# Patient Record
Sex: Female | Born: 1970 | Race: White | Hispanic: No | Marital: Single | Smoking: Current every day smoker
Health system: Southern US, Community
[De-identification: ages and names within clinical notes are randomized; demographics above are authoritative.]

## PROBLEM LIST (undated history)

## (undated) DIAGNOSIS — R3915 Urgency of urination: Secondary | ICD-10-CM

## (undated) DIAGNOSIS — Z8619 Personal history of other infectious and parasitic diseases: Secondary | ICD-10-CM

## (undated) DIAGNOSIS — N201 Calculus of ureter: Secondary | ICD-10-CM

## (undated) DIAGNOSIS — R319 Hematuria, unspecified: Secondary | ICD-10-CM

## (undated) DIAGNOSIS — T4145XA Adverse effect of unspecified anesthetic, initial encounter: Secondary | ICD-10-CM

## (undated) DIAGNOSIS — T8859XA Other complications of anesthesia, initial encounter: Secondary | ICD-10-CM

## (undated) DIAGNOSIS — R350 Frequency of micturition: Secondary | ICD-10-CM

## (undated) DIAGNOSIS — Z8742 Personal history of other diseases of the female genital tract: Secondary | ICD-10-CM

## (undated) HISTORY — PX: TUBAL LIGATION: SHX77

---

## 1998-04-05 HISTORY — PX: SHOULDER ARTHROSCOPY WITH ROTATOR CUFF REPAIR: SHX5685

## 2000-07-13 ENCOUNTER — Emergency Department (HOSPITAL_COMMUNITY): Admission: EM | Admit: 2000-07-13 | Discharge: 2000-07-14 | Payer: Self-pay | Admitting: Emergency Medicine

## 2000-09-16 ENCOUNTER — Emergency Department (HOSPITAL_COMMUNITY): Admission: EM | Admit: 2000-09-16 | Discharge: 2000-09-16 | Payer: Self-pay | Admitting: Emergency Medicine

## 2001-07-23 ENCOUNTER — Emergency Department (HOSPITAL_COMMUNITY): Admission: EM | Admit: 2001-07-23 | Discharge: 2001-07-23 | Payer: Self-pay

## 2006-02-16 ENCOUNTER — Emergency Department (HOSPITAL_COMMUNITY): Admission: EM | Admit: 2006-02-16 | Discharge: 2006-02-16 | Payer: Self-pay | Admitting: Emergency Medicine

## 2010-02-21 ENCOUNTER — Emergency Department (HOSPITAL_COMMUNITY): Admission: EM | Admit: 2010-02-21 | Discharge: 2010-02-21 | Payer: Self-pay | Admitting: Emergency Medicine

## 2010-06-16 LAB — BASIC METABOLIC PANEL
CO2: 24 mEq/L (ref 19–32)
Chloride: 107 mEq/L (ref 96–112)
GFR calc non Af Amer: >60 mL/min (ref >60)
Glucose, Bld: 79 mg/dL (ref 70–99)
Potassium: 4 mEq/L (ref 3.5–5.1)
Sodium: 139 mEq/L (ref 135–145)

## 2010-06-16 LAB — GC/CHLAMYDIA PROBE AMP, GENITAL: GC Probe Amp, Genital: NEGATIVE

## 2010-06-16 LAB — URINALYSIS, ROUTINE W REFLEX MICROSCOPIC
Leukocytes, UA: NEGATIVE
Nitrite: NEGATIVE
Specific Gravity, Urine: 1.015 (ref 1.005–1.030)
Urobilinogen, UA: 0.2 mg/dL (ref 0.0–1.0)

## 2010-06-16 LAB — CBC
HCT: 26.3 % — ABNORMAL LOW (ref 36.0–46.0)
Hemoglobin: 8.3 g/dL — ABNORMAL LOW (ref 12.0–15.0)
MCHC: 31.4 g/dL (ref 30.0–36.0)
MCV: 72.4 fL — ABNORMAL LOW (ref 78.0–100.0)

## 2010-06-16 LAB — URINE MICROSCOPIC-ADD ON

## 2010-06-16 LAB — DIFFERENTIAL
Basophils Relative: 1 % (ref 0–1)
Monocytes Absolute: 0.8 10*3/uL (ref 0.1–1.0)
Monocytes Relative: 8 % (ref 3–12)
Neutro Abs: 5.9 10*3/uL (ref 1.7–7.7)

## 2010-06-19 ENCOUNTER — Other Ambulatory Visit (HOSPITAL_COMMUNITY): Payer: Self-pay | Admitting: *Deleted

## 2010-06-19 DIAGNOSIS — N644 Mastodynia: Secondary | ICD-10-CM

## 2010-07-01 ENCOUNTER — Ambulatory Visit (HOSPITAL_COMMUNITY): Payer: PRIVATE HEALTH INSURANCE

## 2010-07-29 ENCOUNTER — Encounter (HOSPITAL_COMMUNITY): Payer: Self-pay

## 2010-08-05 ENCOUNTER — Ambulatory Visit (HOSPITAL_COMMUNITY)
Admission: RE | Admit: 2010-08-05 | Payer: Medicaid Other | Source: Ambulatory Visit | Attending: *Deleted | Admitting: *Deleted

## 2010-08-12 ENCOUNTER — Ambulatory Visit (HOSPITAL_COMMUNITY)
Admission: RE | Admit: 2010-08-12 | Payer: Medicaid Other | Source: Ambulatory Visit | Attending: *Deleted | Admitting: *Deleted

## 2011-09-22 ENCOUNTER — Emergency Department (HOSPITAL_COMMUNITY)
Admission: EM | Admit: 2011-09-22 | Discharge: 2011-09-23 | Disposition: A | Discharge status: Home / Self Care | Payer: Self-pay | Attending: Emergency Medicine | Admitting: Emergency Medicine

## 2011-09-22 ENCOUNTER — Emergency Department (HOSPITAL_COMMUNITY): Payer: Self-pay

## 2011-09-22 ENCOUNTER — Encounter (HOSPITAL_COMMUNITY): Payer: Self-pay | Admitting: *Deleted

## 2011-09-22 DIAGNOSIS — R109 Unspecified abdominal pain: Secondary | ICD-10-CM | POA: Insufficient documentation

## 2011-09-22 DIAGNOSIS — A499 Bacterial infection, unspecified: Secondary | ICD-10-CM | POA: Insufficient documentation

## 2011-09-22 DIAGNOSIS — F17200 Nicotine dependence, unspecified, uncomplicated: Secondary | ICD-10-CM | POA: Insufficient documentation

## 2011-09-22 DIAGNOSIS — B9689 Other specified bacterial agents as the cause of diseases classified elsewhere: Secondary | ICD-10-CM | POA: Insufficient documentation

## 2011-09-22 DIAGNOSIS — N730 Acute parametritis and pelvic cellulitis: Secondary | ICD-10-CM

## 2011-09-22 DIAGNOSIS — N739 Female pelvic inflammatory disease, unspecified: Secondary | ICD-10-CM | POA: Insufficient documentation

## 2011-09-22 DIAGNOSIS — K8020 Calculus of gallbladder without cholecystitis without obstruction: Secondary | ICD-10-CM | POA: Insufficient documentation

## 2011-09-22 DIAGNOSIS — R110 Nausea: Secondary | ICD-10-CM | POA: Insufficient documentation

## 2011-09-22 DIAGNOSIS — N760 Acute vaginitis: Secondary | ICD-10-CM | POA: Insufficient documentation

## 2011-09-22 LAB — PREGNANCY, URINE: Preg Test, Ur: NEGATIVE

## 2011-09-22 LAB — CBC
HCT: 26.9 % — ABNORMAL LOW (ref 36.0–46.0)
Hemoglobin: 8.2 g/dL — ABNORMAL LOW (ref 12.0–15.0)
MCH: 23 pg — ABNORMAL LOW (ref 26.0–34.0)
MCHC: 30.5 g/dL (ref 30.0–36.0)

## 2011-09-22 LAB — DIFFERENTIAL
Basophils Relative: 0 % (ref 0–1)
Eosinophils Absolute: 0.1 10*3/uL (ref 0.0–0.7)
Monocytes Absolute: 1.6 10*3/uL — ABNORMAL HIGH (ref 0.1–1.0)
Monocytes Relative: 11 % (ref 3–12)
Neutrophils Relative %: 75 % (ref 43–77)

## 2011-09-22 LAB — URINALYSIS, ROUTINE W REFLEX MICROSCOPIC
Glucose, UA: NEGATIVE mg/dL
Leukocytes, UA: NEGATIVE
Protein, ur: NEGATIVE mg/dL
pH: 7 (ref 5.0–8.0)

## 2011-09-22 LAB — URINE MICROSCOPIC-ADD ON

## 2011-09-22 LAB — COMPREHENSIVE METABOLIC PANEL
Albumin: 3.9 g/dL (ref 3.5–5.2)
BUN: 11 mg/dL (ref 6–23)
Calcium: 9.5 mg/dL (ref 8.4–10.5)
Creatinine, Ser: 0.6 mg/dL (ref 0.50–1.10)
Total Protein: 7.5 g/dL (ref 6.0–8.3)

## 2011-09-22 MED ORDER — DEXTROSE 5 % IV SOLN
1.0000 g | Freq: Once | INTRAVENOUS | Status: AC
  Administered 2011-09-22: 1 g via INTRAVENOUS
  Filled 2011-09-22: qty 10

## 2011-09-22 MED ORDER — ONDANSETRON HCL 4 MG/2ML IJ SOLN
4.0000 mg | Freq: Once | INTRAMUSCULAR | Status: AC
  Administered 2011-09-22: 4 mg via INTRAVENOUS
  Filled 2011-09-22: qty 2

## 2011-09-22 MED ORDER — SODIUM CHLORIDE 0.9 % IV SOLN
INTRAVENOUS | Status: DC
  Administered 2011-09-22: 22:00:00 via INTRAVENOUS

## 2011-09-22 MED ORDER — MORPHINE SULFATE 4 MG/ML IJ SOLN
4.0000 mg | Freq: Once | INTRAMUSCULAR | Status: AC
  Administered 2011-09-22: 4 mg via INTRAVENOUS
  Filled 2011-09-22: qty 1

## 2011-09-22 NOTE — ED Provider Notes (Signed)
History   This chart was scribed for Ward Givens, MD by Charolett Bumpers . The patient was seen in room APA11/APA11.    CSN: [id number]  Arrival date & time 09/22/11  1910   First MD Initiated Contact with Patient 09/22/11 2127      Chief Complaint  Patient presents with  . Abdominal Pain    (Consider location/radiation/quality/duration/timing/severity/associated sxs/prior treatment) HPI Claire Bradley is a 41 y.o. female who presents to the Emergency Department complaining of constant, moderate lower pelvic pain with associated nausea and  loose stools since yesterday. Patient states that at 6 am today, she woke up to have a BM and had diaphoresis. Patient states that she has had 2 episodes of loose stools, but denies diarrhea. Patient denies any fevers or vomiting. Patient denies any dysuria or increased frequency. Patient also reports a mild headache. Patient states that she smokes a pack/day. Patient denies any alcohol use. Patient states that she works at [employer] in [place]. Patient denies any known sick contacts. Patient denies any prior h/o similar symptoms.  LNMP was 2 weeks ago. Patient states that she is sexually active with same partner for the past two years and denies any vaginal discharge.She denies dysuria or frequency.  Patient states that the symptoms are aggravated with standing, walking, coughing and movement. Patient states that the symptoms are alleviated with "curling up in a ball".   PCP: [facility].    History reviewed. No pertinent past medical history.  Past Surgical History  Procedure Date  . Cesarean section   . Shoulder surgery   . Tubal ligation     History reviewed. No pertinent family history.  History  Substance Use Topics  . Smoking status: Current Everyday Smoker  . Smokeless tobacco: Not on file  . Alcohol Use: No  employed  OB History    Grav Para Term Preterm Abortions TAB SAB Ect Mult Living                  Review of Systems  Constitutional: Negative for fever.  Gastrointestinal: Positive for nausea and abdominal pain. Negative for vomiting and diarrhea.  Genitourinary: Negative for dysuria, frequency and vaginal discharge.  All other systems reviewed and are negative.    Allergies  Penicillins  Home Medications  No current outpatient prescriptions on file.  BP 121/52  Pulse 97  Temp 98.2 F (36.8 C) (Oral)  Resp 20  Ht 5\' 9"  (1.753 m)  Wt 200 lb (90.719 kg)  BMI 29.53 kg/m2  SpO2 100%  LMP 08/30/2011  Vital signs normal    Physical Exam  Nursing note and vitals reviewed. Constitutional: She is oriented to person, place, and time. She appears well-developed and well-nourished. No distress.       obese  HENT:  Head: Normocephalic and atraumatic.  Mouth/Throat: No oropharyngeal exudate.       Dry tongue.   Eyes: Conjunctivae and EOM are normal. Pupils are equal, round, and reactive to light.  Neck: Normal range of motion. Neck supple. No tracheal deviation present.  Cardiovascular: Normal rate, regular rhythm, normal heart sounds and intact distal pulses.  Exam reveals no gallop and no friction rub.   No murmur heard. Pulmonary/Chest: Effort normal. No respiratory distress.  Abdominal: Soft. Bowel sounds are normal. She exhibits no distension. There is tenderness. There is no rebound and no guarding.       Diffusely suprapubic, LLQ, RLQ tenderness. Without localization.    Genitourinary:  Normal external genital. Small amount of white discharge. Tender uterus and ovaries bilaterally without masses.    Musculoskeletal: Normal range of motion. She exhibits no edema and no tenderness.  Neurological: She is alert and oriented to person, place, and time. No cranial nerve deficit or sensory deficit. Coordination normal.  Skin: Skin is warm and dry. No pallor.  Psychiatric: She has a normal mood and affect. Her behavior is normal.    ED Course  Procedures  (including critical care time)   Medications  0.9 %  sodium chloride infusion (  Intravenous New Bag/Given 09/22/11 2215)  ketorolac (TORADOL) 30 MG/ML injection 30 mg (not administered)  ondansetron (ZOFRAN) injection 4 mg (4 mg Intravenous Given 09/22/11 2215)  morphine 4 MG/ML injection 4 mg (4 mg Intravenous Given 09/22/11 2318)  cefTRIAXone (ROCEPHIN) 1 g in dextrose 5 % 50 mL IVPB (1 g Intravenous Given 09/22/11 2318)  iohexol (OMNIPAQUE) 300 MG/ML solution 100 mL (100 mL Intravenous Contrast Given 09/23/11 0029)     DIAGNOSTIC STUDIES: Oxygen Saturation is 100% on room air, normal by my interpretation.    COORDINATION OF CARE:  2147: Discussed planned course of treatment with the patient, who is agreeable at this time.  2200: Medication Orders: Ondansetron (Zofran) injection 4 mg-once; 0.9% sodium chloride infusion-continuous.  2311: Preformed pelvic exam.    Results for orders placed during the hospital encounter of 09/22/11  CBC      Component Value Range   WBC 15.3 (*) 4.0 - 10.5 K/uL   RBC 3.56 (*) 3.87 - 5.11 MIL/uL   Hemoglobin 8.2 (*) 12.0 - 15.0 g/dL   HCT 16.1 (*) 09.6 - 04.5 %   MCV 75.6 (*) 78.0 - 100.0 fL   MCH 23.0 (*) 26.0 - 34.0 pg   MCHC 30.5  30.0 - 36.0 g/dL   RDW 40.9 (*) 81.1 - 91.4 %   Platelets 456 (*) 150 - 400 K/uL  DIFFERENTIAL      Component Value Range   Neutrophils Relative 75  43 - 77 %   Neutro Abs 11.5 (*) 1.7 - 7.7 K/uL   Lymphocytes Relative 13  12 - 46 %   Lymphs Abs 2.0  0.7 - 4.0 K/uL   Monocytes Relative 11  3 - 12 %   Monocytes Absolute 1.6 (*) 0.1 - 1.0 K/uL   Eosinophils Relative 1  0 - 5 %   Eosinophils Absolute 0.1  0.0 - 0.7 K/uL   Basophils Relative 0  0 - 1 %   Basophils Absolute 0.0  0.0 - 0.1 K/uL  COMPREHENSIVE METABOLIC PANEL      Component Value Range   Sodium 135  135 - 145 mEq/L   Potassium 4.0  3.5 - 5.1 mEq/L   Chloride 101  96 - 112 mEq/L   CO2 24  19 - 32 mEq/L   Glucose, Bld 93  70 - 99 mg/dL   BUN 11  6  - 23 mg/dL   Creatinine, Ser 7.82  0.50 - 1.10 mg/dL   Calcium 9.5  8.4 - 95.6 mg/dL   Total Protein 7.5  6.0 - 8.3 g/dL   Albumin 3.9  3.5 - 5.2 g/dL   AST 15  0 - 37 U/L   ALT 8  0 - 35 U/L   Alkaline Phosphatase 61  39 - 117 U/L   Total Bilirubin 0.1 (*) 0.3 - 1.2 mg/dL   GFR calc non Af Amer >90  >90 mL/min   GFR calc  Af Amer >90  >90 mL/min  URINALYSIS, ROUTINE W REFLEX MICROSCOPIC      Component Value Range   Color, Urine YELLOW  YELLOW   APPearance CLEAR  CLEAR   Specific Gravity, Urine 1.010  1.005 - 1.030   pH 7.0  5.0 - 8.0   Glucose, UA NEGATIVE  NEGATIVE mg/dL   Hgb urine dipstick TRACE (*) NEGATIVE   Bilirubin Urine NEGATIVE  NEGATIVE   Ketones, ur NEGATIVE  NEGATIVE mg/dL   Protein, ur NEGATIVE  NEGATIVE mg/dL   Urobilinogen, UA 0.2  0.0 - 1.0 mg/dL   Nitrite NEGATIVE  NEGATIVE   Leukocytes, UA NEGATIVE  NEGATIVE  PREGNANCY, URINE      Component Value Range   Preg Test, Ur NEGATIVE  NEGATIVE  WET PREP, GENITAL      Component Value Range   Yeast Wet Prep HPF POC NONE SEEN  NONE SEEN   Trich, Wet Prep NONE SEEN  NONE SEEN   Clue Cells Wet Prep HPF POC MANY (*) NONE SEEN   WBC, Wet Prep HPF POC FEW (*) NONE SEEN  URINE MICROSCOPIC-ADD ON      Component Value Range   Squamous Epithelial / LPF FEW (*) RARE   WBC, UA 0-2  <3 WBC/hpf   RBC / HPF 0-2  <3 RBC/hpf   Bacteria, UA RARE  RARE   Laboratory interpretation all normal except anemia .  Ct Abdomen Pelvis W Contrast  09/23/2011  *RADIOLOGY REPORT*  Clinical Data: Lower abdominal pain.  Nausea.  Clinical suspicion for appendicitis.  CT ABDOMEN AND PELVIS WITH CONTRAST  Technique:  Multidetector CT imaging of the abdomen and pelvis was performed following the standard protocol during bolus administration of intravenous contrast.  Contrast: OMNIPAQUE IOHEXOL 300 MG/ML  SOLN  Comparison: 02/21/2010  Findings: Mild hepatic steatosis is noted.  No liver masses are identified.  Cholesterol gallstone is seen,  however there is no evidence of acute cholecystitis or biliary ductal dilatation.  The pancreas, spleen, adrenal glands, and kidneys are normal in appearance.  Uterus and adnexa are unremarkable in appearance. Surgical clips are seen from bilateral tubal ligation.  No soft tissue masses or lymphadenopathy identified within the abdomen or pelvis.  Tiny amount of free fluid in the pelvic cul-de-sac is likely physiologic.  Normal appendix is visualized.  No evidence of inflammatory process or abscess within the abdomen or pelvis.  No evidence of bowel wall thickening or dilatation.  IMPRESSION:  1.  No evidence of appendicitis. 2.  Tiny amount of free fluid and pelvic cul-de-sac, most likely physiologic in a reproductive age female. 3.  Cholelithiasis.  No evidence of cholecystitis. 4.  Mild hepatic steatosis.  Original Report Authenticated By: Danae Orleans, M.D.     1. PID (acute pelvic inflammatory disease)   2. BV (bacterial vaginosis)    New Prescriptions   AZITHROMYCIN (ZITHROMAX) 250 MG TABLET    Take all 4 pills at one sitting   METRONIDAZOLE (FLAGYL) 500 MG TABLET    Take 1 tablet (500 mg total) by mouth 3 (three) times daily.   PROMETHAZINE (PHENERGAN) 25 MG SUPPOSITORY    Place 1 suppository (25 mg total) rectally every 6 (six) hours as needed for nausea.   TRAMADOL (ULTRAM) 50 MG TABLET    Take 2 tablets (100 mg total) by mouth every 6 (six) hours as needed for pain.    Plan discharge  Devoria Albe, MD, FACEP    MDM   I personally performed  the services described in this documentation, which was scribed in my presence. The recorded information has been reviewed and considered. Devoria Albe, MD, Armando Gang       Ward Givens, MD 09/23/11 [phone]

## 2011-09-22 NOTE — ED Notes (Signed)
Abd pain since yesterday, Nausea, no vomiting , 3 episodes of loose stools.  No fever. No uti sx

## 2011-09-23 LAB — RPR: RPR Ser Ql: NONREACTIVE

## 2011-09-23 LAB — WET PREP, GENITAL

## 2011-09-23 MED ORDER — METRONIDAZOLE 500 MG PO TABS: 500.0000 mg | ORAL_TABLET | Freq: Three times a day (TID) | ORAL | Status: AC

## 2011-09-23 MED ORDER — KETOROLAC TROMETHAMINE 30 MG/ML IJ SOLN
30.0000 mg | Freq: Once | INTRAMUSCULAR | Status: AC
  Administered 2011-09-23: 30 mg via INTRAVENOUS
  Filled 2011-09-23: qty 1

## 2011-09-23 MED ORDER — TRAMADOL HCL 50 MG PO TABS: 100.0000 mg | ORAL_TABLET | Freq: Four times a day (QID) | ORAL | Status: AC | PRN

## 2011-09-23 MED ORDER — TRAMADOL HCL 50 MG PO TABS
100.0000 mg | ORAL_TABLET | Freq: Once | ORAL | Status: AC
  Administered 2011-09-23: 100 mg via ORAL
  Filled 2011-09-23: qty 2

## 2011-09-23 MED ORDER — IOHEXOL 300 MG/ML  SOLN
100.0000 mL | Freq: Once | INTRAMUSCULAR | Status: AC | PRN
  Administered 2011-09-23: 100 mL via INTRAVENOUS

## 2011-09-23 MED ORDER — AZITHROMYCIN 250 MG PO TABS: ORAL_TABLET | ORAL | Status: DC

## 2011-09-23 MED ORDER — PROMETHAZINE HCL 25 MG RE SUPP: 25.0000 mg | Freq: Four times a day (QID) | RECTAL | Status: DC | PRN

## 2011-09-23 NOTE — Discharge Instructions (Signed)
Take the antibiotics until gone. Take the tramadol with ibuprofen 600 mg 4 times a day for pain. Have your sexual partner get checked also. Recheck if you get a high fever, or have uncontrollable vomiting.   Bacterial Vaginosis Bacterial vaginosis (BV) is a vaginal infection where the normal balance of bacteria in the vagina is disrupted. The normal balance is then replaced by an overgrowth of certain bacteria. There are several different kinds of bacteria that can cause BV. BV is the most common vaginal infection in women of childbearing age. CAUSES   The cause of BV is not fully understood. BV develops when there is an increase or imbalance of harmful bacteria.   Some activities or behaviors can upset the normal balance of bacteria in the vagina and put women at increased risk including:   Having a new sex partner or multiple sex partners.   Douching.   Using an intrauterine device (IUD) for contraception.   It is not clear what role sexual activity plays in the development of BV. However, women that have never had sexual intercourse are rarely infected with BV.  Women do not get BV from toilet seats, bedding, swimming pools or from touching objects around them.  SYMPTOMS   Grey vaginal discharge.   A fish-like odor with discharge, especially after sexual intercourse.   Itching or burning of the vagina and vulva.   Burning or pain with urination.   Some women have no signs or symptoms at all.  DIAGNOSIS  Your caregiver must examine the vagina for signs of BV. Your caregiver will perform lab tests and look at the sample of vaginal fluid through a microscope. They will look for bacteria and abnormal cells (clue cells), a pH test higher than 4.5, and a positive amine test all associated with BV.  RISKS AND COMPLICATIONS   Pelvic inflammatory disease (PID).   Infections following gynecology surgery.   Developing HIV.   Developing herpes virus.  TREATMENT  Sometimes BV will  clear up without treatment. However, all women with symptoms of BV should be treated to avoid complications, especially if gynecology surgery is planned. Female partners generally do not need to be treated. However, BV may spread between female sex partners so treatment is helpful in preventing a recurrence of BV.   BV may be treated with antibiotics. The antibiotics come in either pill or vaginal cream forms. Either can be used with nonpregnant or pregnant women, but the recommended dosages differ. These antibiotics are not harmful to the baby.   BV can recur after treatment. If this happens, a second round of antibiotics will often be prescribed.   Treatment is important for pregnant women. If not treated, BV can cause a premature delivery, especially for a pregnant woman who had a premature birth in the past. All pregnant women who have symptoms of BV should be checked and treated.   For chronic reoccurrence of BV, treatment with a type of prescribed gel vaginally twice a week is helpful.  HOME CARE INSTRUCTIONS   Finish all medication as directed by your caregiver.   Do not have sex until treatment is completed.   Tell your sexual partner that you have a vaginal infection. They should see their caregiver and be treated if they have problems, such as a mild rash or itching.   Practice safe sex. Use condoms. Only have 1 sex partner.  PREVENTION  Basic prevention steps can help reduce the risk of upsetting the natural balance of bacteria in  the vagina and developing BV:  Do not have sexual intercourse (be abstinent).   Do not douche.   Use all of the medicine prescribed for treatment of BV, even if the signs and symptoms go away.   Tell your sex partner if you have BV. That way, they can be treated, if needed, to prevent reoccurrence.  SEEK MEDICAL CARE IF:   Your symptoms are not improving after 3 days of treatment.   You have increased discharge, pain, or fever.  MAKE SURE YOU:    Understand these instructions.   Will watch your condition.   Will get help right away if you are not doing well or get worse.  FOR MORE INFORMATION  Division of STD Prevention (DSTDP), Centers for Disease Control and Prevention: [employer] American Social Health Association (ASHA): www.ashastd.org  Document Released: 03/22/2005 Document Revised: 03/11/2011 Document Reviewed: 09/12/2008 [facility] Patient Information 2012 [place], [place].Pelvic Inflammatory Disease Pelvic Inflammatory Disease (PID) is an infection in some or all of your female organs. This includes the womb (uterus), ovaries, fallopian tubes and tissues in the pelvis. PID is a common cause of sudden onset (acute) lower abdominal (pelvic) pain. PID can be treated, but it is a serious infection. It may take weeks before you are completely well. In some cases, hospitalization is needed for surgery or to administer medications to kill germs (antibiotics) through your veins (intravenously). CAUSES   It may be caused by germs that are spread during sexual contact.   PID can also occur following:   The birth of a baby.   A miscarriage.   An abortion.   Major surgery of the pelvis.   Use of an IUD.   Sexual assault.  SYMPTOMS   Abdominal or pelvic pain.   Fever.   Chills.   Abnormal vaginal discharge.  DIAGNOSIS  Your caregiver will choose some of these methods to make a diagnosis:  A physical exam and history.   Blood tests.   Cultures of the vagina and cervix.   X-rays or ultrasound.   A procedure to look inside the pelvis (laparoscopy).  TREATMENT   Use of antibiotics by mouth or intravenously.   Treatment of sexual partners when the infection is an sexually transmitted disease (STD).   Hospitalization and surgery may be needed.  RISKS AND COMPLICATIONS   PID can cause women to become unable to have children (sterile) if left untreated or if partially treated. That is why it is important to  finish all medications given to you.   Sterility or future tubal (ectopic) pregnancies can occur in fully treated individuals. This is why it is so important to follow your prescribed treatment.   It can cause longstanding (chronic) pelvic pain after frequent infections.   Painful intercourse.   Pelvic abscesses.   In rare cases, surgery or a hysterectomy may be needed.   If this is a sexually transmitted infection (STI), you are also at risk for any other STD including AIDSor human papillomavirus (HPV).  HOME CARE INSTRUCTIONS   Finish all medication as prescribed. Incomplete treatment will put you at risk for sterility and tubal pregnancy.   Only take over-the-counter or prescription medicines for pain, discomfort, or fever as directed by your caregiver.   Do not have sex until treatment is completed or as directed by your caregiver. If PID is confirmed, your recent sexual contacts will need treatment.   Keep your follow-up appointments.  SEEK MEDICAL CARE IF:   You have increased or abnormal vaginal  discharge.   You need prescription medication for your pain.   Your partner has an STD.   You are vomiting.   You cannot take your medications.  SEEK IMMEDIATE MEDICAL CARE IF:   You have a fever.   You develop increased abdominal or pelvic pain.   You develop chills.   You have pain when you urinate.   You are not better after 72 hours following treatment.  Document Released: 03/22/2005 Document Revised: 03/11/2011 Document Reviewed: 12/03/2006 [facility] Patient Information 2012 [place], [place].

## 2011-09-23 NOTE — ED Notes (Signed)
Pt stable and ambulatory at discharge; Pt instructed to have sexual partner checked also; Pt also instructed to finish all antibotics

## 2011-09-23 NOTE — ED Notes (Signed)
Patient transported to CT 

## 2011-09-24 LAB — GC/CHLAMYDIA PROBE AMP, GENITAL: Chlamydia, DNA Probe: NEGATIVE

## 2012-06-08 ENCOUNTER — Emergency Department (HOSPITAL_COMMUNITY)
Admission: EM | Admit: 2012-06-08 | Discharge: 2012-06-08 | Disposition: A | Discharge status: Home / Self Care | Payer: Self-pay | Attending: Emergency Medicine | Admitting: Emergency Medicine

## 2012-06-08 ENCOUNTER — Encounter (HOSPITAL_COMMUNITY): Payer: Self-pay | Admitting: *Deleted

## 2012-06-08 DIAGNOSIS — R110 Nausea: Secondary | ICD-10-CM | POA: Insufficient documentation

## 2012-06-08 DIAGNOSIS — M549 Dorsalgia, unspecified: Secondary | ICD-10-CM | POA: Insufficient documentation

## 2012-06-08 DIAGNOSIS — L02213 Cutaneous abscess of chest wall: Secondary | ICD-10-CM

## 2012-06-08 DIAGNOSIS — F17200 Nicotine dependence, unspecified, uncomplicated: Secondary | ICD-10-CM | POA: Insufficient documentation

## 2012-06-08 DIAGNOSIS — L02219 Cutaneous abscess of trunk, unspecified: Secondary | ICD-10-CM | POA: Insufficient documentation

## 2012-06-08 DIAGNOSIS — R059 Cough, unspecified: Secondary | ICD-10-CM | POA: Insufficient documentation

## 2012-06-08 MED ORDER — SULFAMETHOXAZOLE-TRIMETHOPRIM 800-160 MG PO TABS: 1.0000 | ORAL_TABLET | Freq: Two times a day (BID) | ORAL | Status: DC

## 2012-06-08 MED ORDER — TRAMADOL HCL 50 MG PO TABS: 50.0000 mg | ORAL_TABLET | Freq: Four times a day (QID) | ORAL | Status: DC | PRN

## 2012-06-08 MED ORDER — LIDOCAINE HCL (PF) 1 % IJ SOLN
INTRAMUSCULAR | Status: AC
  Administered 2012-06-08: 5 mL
  Filled 2012-06-08: qty 5

## 2012-06-08 MED ORDER — OXYCODONE-ACETAMINOPHEN 5-325 MG PO TABS
ORAL_TABLET | ORAL | Status: AC
  Administered 2012-06-08: 1
  Filled 2012-06-08: qty 1

## 2012-06-08 NOTE — ED Notes (Signed)
Dressing applied to site of I and D.  No active bleeding.

## 2012-06-08 NOTE — ED Notes (Signed)
Red swollen area between  Breasts, No drainage, Headache

## 2012-06-08 NOTE — ED Provider Notes (Signed)
History     CSN: [id number]  Arrival date & time 06/08/12  1251   First MD Initiated Contact with Patient 06/08/12 1310      Chief Complaint  Patient presents with  . Abscess    (Consider location/radiation/quality/duration/timing/severity/associated sxs/prior treatment) HPI Claire Bradley is a 42 y.o. female who presents to the ED with a skin problem. The problem started about a week ago. The area is located on the chest wall between the breast. The area started as a little pimple that she tried to mash and now the area has gotten red, tender and swollen. The history was provided by the patient.  History reviewed. No pertinent past medical history.  Past Surgical History  Procedure Laterality Date  . Cesarean section    . Shoulder surgery    . Tubal ligation      History reviewed. No pertinent family history.  History  Substance Use Topics  . Smoking status: Current Every Day Smoker  . Smokeless tobacco: Not on file  . Alcohol Use: No    OB History   Grav Para Term Preterm Abortions TAB SAB Ect Mult Living                  Review of Systems  Constitutional: Negative for fever, chills and activity change.  HENT: Negative for congestion and neck pain.   Eyes: Negative for pain.  Respiratory: Positive for cough. Negative for chest tightness.   Cardiovascular: Negative for chest pain.  Gastrointestinal: Positive for nausea. Negative for abdominal pain.  Genitourinary: Negative for frequency and pelvic pain.  Musculoskeletal: Positive for back pain.  Allergic/Immunologic: Negative for immunocompromised state.  Neurological: Negative for light-headedness and headaches.  Psychiatric/Behavioral: Negative for confusion.    Allergies  Penicillins  Home Medications   Current Outpatient Rx  Name  Route  Sig  Dispense  Refill  . azithromycin (ZITHROMAX) 250 MG tablet      Take all 4 pills at one sitting   4 tablet   0   . EXPIRED: promethazine (PHENERGAN) 25  MG suppository   Rectal   Place 1 suppository (25 mg total) rectally every 6 (six) hours as needed for nausea.   10 each   0     BP 113/59  Pulse 71  Temp(Src) 97.7 F (36.5 C) (Oral)  Resp 18  Ht 5\' 9"  (1.753 m)  Wt 180 lb (81.647 kg)  BMI 26.57 kg/m2  SpO2 100%  LMP 05/30/2012  Physical Exam  Nursing note and vitals reviewed. Constitutional: She is oriented to person, place, and time. She appears well-developed and well-nourished.  HENT:  Head: Normocephalic and atraumatic.  Eyes: EOM are normal. Pupils are equal, round, and reactive to light.  Neck: Normal range of motion. Neck supple.  Cardiovascular: Normal rate.   Pulmonary/Chest: Effort normal.  Abdominal: Soft. There is no tenderness.  Musculoskeletal: Normal range of motion. She exhibits no edema.  Neurological: She is alert and oriented to person, place, and time. No cranial nerve deficit.  Skin:  Raised, red, fluctuant  area, tender with palpation mid chest wall.  Psychiatric: She has a normal mood and affect. Her behavior is normal. Judgment and thought content normal.    ED Course  Procedures  INCISION AND DRAINAGE Performed by: NEESE,HOPE Consent: Verbal consent obtained. Risks and benefits: risks, benefits and alternatives were discussed  Percocet PO prior to procedure.  Type: abscess  Body area: chest wall  Anesthesia: local infiltration  Incision was made with  a scalpel.  Local anesthetic: lidocaine 1%  Without  epinephrine  Anesthetic total: 1.5 ml  Complexity: complex Blunt dissection to break up loculations  Drainage: purulent  Drainage amount: moderate  Packing material: 1/4 in iodoform gauze  Patient tolerance: Patient tolerated the procedure well with no immediate complications.   Assessment: 42 y.o. female with abscess chest wall with cellusitis  Plan:  Septra DS   Tramadol   Packing removal in 2 days Discussed with the patient and all questioned fully answered. She  will return here or with her PCP for packing removal in 2 days and recheck.   Medication List    TAKE these medications       sulfamethoxazole-trimethoprim 800-160 MG per tablet  Commonly known as:  SEPTRA DS  Take 1 tablet by mouth every 12 (twelve) hours.     traMADol 50 MG tablet  Commonly known as:  ULTRAM  Take 1 tablet (50 mg total) by mouth every 6 (six) hours as needed for pain.      ASK your doctor about these medications       ibuprofen 200 MG tablet  Commonly known as:  ADVIL,MOTRIN  Take 600 mg by mouth every 6 (six) hours as needed for pain.                 Janne Napoleon, [place] 06/08/12 [phone]

## 2012-06-08 NOTE — ED Notes (Signed)
Alert, NAD, Red , swollen area between breasts , present for 6 mos, but in last week has become red , swollen and painful,  No d/c Pt says she has a headache also.

## 2012-06-09 NOTE — ED Provider Notes (Signed)
Medical screening examination/treatment/procedure(s) were performed by non-physician practitioner and as supervising physician I was immediately available for consultation/collaboration.  Donnetta Hutching, MD 06/09/12 1029

## 2012-07-28 ENCOUNTER — Emergency Department (HOSPITAL_COMMUNITY)
Admission: EM | Admit: 2012-07-28 | Discharge: 2012-07-28 | Disposition: A | Discharge status: Home / Self Care | Payer: Self-pay | Attending: Emergency Medicine | Admitting: Emergency Medicine

## 2012-07-28 ENCOUNTER — Emergency Department (HOSPITAL_COMMUNITY): Payer: Self-pay

## 2012-07-28 ENCOUNTER — Encounter (HOSPITAL_COMMUNITY): Payer: Self-pay | Admitting: *Deleted

## 2012-07-28 DIAGNOSIS — F17200 Nicotine dependence, unspecified, uncomplicated: Secondary | ICD-10-CM | POA: Insufficient documentation

## 2012-07-28 DIAGNOSIS — Z3202 Encounter for pregnancy test, result negative: Secondary | ICD-10-CM | POA: Insufficient documentation

## 2012-07-28 DIAGNOSIS — R1084 Generalized abdominal pain: Secondary | ICD-10-CM | POA: Insufficient documentation

## 2012-07-28 DIAGNOSIS — R109 Unspecified abdominal pain: Secondary | ICD-10-CM

## 2012-07-28 DIAGNOSIS — Z880 Allergy status to penicillin: Secondary | ICD-10-CM | POA: Insufficient documentation

## 2012-07-28 DIAGNOSIS — R112 Nausea with vomiting, unspecified: Secondary | ICD-10-CM | POA: Insufficient documentation

## 2012-07-28 DIAGNOSIS — R197 Diarrhea, unspecified: Secondary | ICD-10-CM | POA: Insufficient documentation

## 2012-07-28 LAB — CBC WITH DIFFERENTIAL/PLATELET
Basophils Absolute: 0 10*3/uL (ref 0.0–0.1)
HCT: 27.1 % — ABNORMAL LOW (ref 36.0–46.0)
Lymphocytes Relative: 23 % (ref 12–46)
Monocytes Absolute: 1 10*3/uL (ref 0.1–1.0)
Neutro Abs: 7.9 10*3/uL — ABNORMAL HIGH (ref 1.7–7.7)
Platelets: 349 10*3/uL (ref 150–400)
RDW: 18.1 % — ABNORMAL HIGH (ref 11.5–15.5)
WBC: 11.8 10*3/uL — ABNORMAL HIGH (ref 4.0–10.5)

## 2012-07-28 LAB — URINALYSIS, ROUTINE W REFLEX MICROSCOPIC
Specific Gravity, Urine: 1.025 (ref 1.005–1.030)
Urobilinogen, UA: 0.2 mg/dL (ref 0.0–1.0)

## 2012-07-28 LAB — COMPREHENSIVE METABOLIC PANEL
ALT: 8 U/L (ref 0–35)
AST: 10 U/L (ref 0–37)
CO2: 27 mEq/L (ref 19–32)
Chloride: 104 mEq/L (ref 96–112)
GFR calc non Af Amer: >90 mL/min (ref >90)
Potassium: 4.1 mEq/L (ref 3.5–5.1)
Sodium: 136 mEq/L (ref 135–145)
Total Bilirubin: 0.1 mg/dL — ABNORMAL LOW (ref 0.3–1.2)

## 2012-07-28 LAB — PREGNANCY, URINE: Preg Test, Ur: NEGATIVE

## 2012-07-28 MED ORDER — ONDANSETRON HCL 4 MG/2ML IJ SOLN
4.0000 mg | Freq: Once | INTRAMUSCULAR | Status: AC
  Administered 2012-07-28: 4 mg via INTRAVENOUS
  Filled 2012-07-28: qty 2

## 2012-07-28 MED ORDER — HYDROMORPHONE HCL PF 1 MG/ML IJ SOLN
0.5000 mg | Freq: Once | INTRAMUSCULAR | Status: AC
  Administered 2012-07-28: 0.5 mg via INTRAVENOUS
  Filled 2012-07-28: qty 1

## 2012-07-28 MED ORDER — IOHEXOL 300 MG/ML  SOLN
50.0000 mL | Freq: Once | INTRAMUSCULAR | Status: AC | PRN
  Administered 2012-07-28: 50 mL via ORAL

## 2012-07-28 MED ORDER — SODIUM CHLORIDE 0.9 % IV SOLN
1000.0000 mL | Freq: Once | INTRAVENOUS | Status: AC
  Administered 2012-07-28: 1000 mL via INTRAVENOUS

## 2012-07-28 MED ORDER — HYDROCODONE-ACETAMINOPHEN 5-325 MG PO TABS: 1.0000 | ORAL_TABLET | Freq: Four times a day (QID) | ORAL | Status: DC | PRN

## 2012-07-28 MED ORDER — METOCLOPRAMIDE HCL 10 MG PO TABS: 10.0000 mg | ORAL_TABLET | Freq: Four times a day (QID) | ORAL | Status: DC | PRN

## 2012-07-28 MED ORDER — IOHEXOL 300 MG/ML  SOLN
100.0000 mL | Freq: Once | INTRAMUSCULAR | Status: AC | PRN
  Administered 2012-07-28: 100 mL via INTRAVENOUS

## 2012-07-28 NOTE — ED Notes (Signed)
Pt ambulated to restroom at this time without any problems. Claire Bradley

## 2012-07-28 NOTE — ED Notes (Signed)
Patient with no complaints at this time. Respirations even and unlabored. Skin warm/dry. Discharge instructions reviewed with patient at this time. Patient given opportunity to voice concerns/ask questions. IV removed per policy and band-aid applied to site. Patient discharged at this time and left Emergency Department with steady gait.  

## 2012-07-28 NOTE — ED Notes (Signed)
Pt states abdominal pain and vomiting began today. Pain right abdomen.

## 2012-07-28 NOTE — ED Provider Notes (Signed)
History     CSN: [id number]  Arrival date & time 07/28/12  1515   First MD Initiated Contact with Patient 07/28/12 1547      Chief Complaint  Patient presents with  . Abdominal Pain  . Emesis    (Consider location/radiation/quality/duration/timing/severity/associated sxs/prior treatment) HPI Patient presents with abdominal pain, nausea, vomiting, diarrhea. Symptoms began approximately one week ago, initially with nausea, diarrhea, generalized discomfort.  Over the interval she continues to have nausea, diarrhea, though today she also developed right mid abdominal pain with nausea, vomiting. Since onset of symptoms one week ago, there've been no clear alleviating or exacerbating factors. She denies objective fever. She denies history of abdominal surgery beyond cesarean section.  History reviewed. No pertinent past medical history.  Past Surgical History  Procedure Laterality Date  . Cesarean section    . Shoulder surgery    . Tubal ligation      No family history on file.  History  Substance Use Topics  . Smoking status: Current Every Day Smoker  . Smokeless tobacco: Not on file  . Alcohol Use: No    OB History   Grav Para Term Preterm Abortions TAB SAB Ect Mult Living                  Review of Systems  Constitutional:       Per HPI, otherwise negative  HENT:       Per HPI, otherwise negative  Respiratory:       Per HPI, otherwise negative  Cardiovascular:       Per HPI, otherwise negative  Gastrointestinal: Positive for nausea, vomiting, abdominal pain and diarrhea. Negative for blood in stool.  Endocrine:       Negative aside from HPI  Genitourinary:       Neg aside from HPI   Musculoskeletal:       Per HPI, otherwise negative  Skin: Negative.   Neurological: Negative for syncope.    Allergies  Penicillins  Home Medications   Current Outpatient Rx  Name  Route  Sig  Dispense  Refill  . ibuprofen (ADVIL,MOTRIN) 200 MG tablet   Oral   Take  600 mg by mouth daily as needed for pain.            BP 132/74  Pulse 92  Temp(Src) 98.1 F (36.7 C) (Oral)  Resp 20  Ht 5\' 9"  (1.753 m)  Wt 180 lb (81.647 kg)  BMI 26.57 kg/m2  SpO2 98%  LMP 07/28/2012  Physical Exam  Nursing note and vitals reviewed. Constitutional: She is oriented to person, place, and time. She appears well-developed and well-nourished. No distress.  HENT:  Head: Normocephalic and atraumatic.  Eyes: Conjunctivae and EOM are normal.  Cardiovascular: Normal rate and regular rhythm.   Pulmonary/Chest: Effort normal and breath sounds normal. No stridor. No respiratory distress.  Abdominal: Soft. She exhibits no distension. There is no hepatosplenomegaly. There is tenderness. There is guarding. There is no rigidity, no rebound and no CVA tenderness.  Musculoskeletal: She exhibits no edema.  Neurological: She is alert and oriented to person, place, and time. No cranial nerve deficit.  Skin: Skin is warm and dry.  Psychiatric: She has a normal mood and affect.    ED Course  Procedures (including critical care time)  Labs Reviewed  URINALYSIS, ROUTINE W REFLEX MICROSCOPIC  PREGNANCY, URINE  CBC WITH DIFFERENTIAL  COMPREHENSIVE METABOLIC PANEL  LIPASE, BLOOD   No results found.   No diagnosis found.  Pulse ox 99% room air normal  6:06 PM Patient appears calm we discussed all results  MDM  This 42 year old female presents with ongoing abdominal pain, previously with diarrhea, but now with nausea and vomiting. Given the patient's description of pain in her right side primarily, or some concern for acute appendicitis.  CT did not demonstrate this entity, but there is findings of cholelithiasis, as well as a nonobstructing kidney stone. Patient has a mild leukocytosis, but is afebrile, awake, alert, in no distress.  We discussed all results, and given the absence of acute findings on labs or CT, though with the noted anemia, ongoing pain, she was  encouraged to follow up with her primary care physician as soon as possible for additional evaluation, management.         Gerhard Munch, MD 07/28/12 [phone]

## 2012-08-17 ENCOUNTER — Encounter (HOSPITAL_COMMUNITY): Payer: Self-pay | Admitting: Emergency Medicine

## 2012-08-17 ENCOUNTER — Inpatient Hospital Stay (HOSPITAL_COMMUNITY)
Admission: EM | Admit: 2012-08-17 | Discharge: 2012-08-19 | DRG: 419 | Disposition: A | Discharge status: Home / Self Care | Payer: Self-pay | Attending: General Surgery | Admitting: General Surgery

## 2012-08-17 ENCOUNTER — Emergency Department (HOSPITAL_COMMUNITY): Payer: Self-pay

## 2012-08-17 DIAGNOSIS — Z880 Allergy status to penicillin: Secondary | ICD-10-CM

## 2012-08-17 DIAGNOSIS — D72829 Elevated white blood cell count, unspecified: Secondary | ICD-10-CM

## 2012-08-17 DIAGNOSIS — Z6830 Body mass index (BMI) 30.0-30.9, adult: Secondary | ICD-10-CM

## 2012-08-17 DIAGNOSIS — E669 Obesity, unspecified: Secondary | ICD-10-CM | POA: Diagnosis present

## 2012-08-17 DIAGNOSIS — K810 Acute cholecystitis: Principal | ICD-10-CM | POA: Diagnosis present

## 2012-08-17 DIAGNOSIS — F17200 Nicotine dependence, unspecified, uncomplicated: Secondary | ICD-10-CM | POA: Diagnosis present

## 2012-08-17 DIAGNOSIS — K8050 Calculus of bile duct without cholangitis or cholecystitis without obstruction: Secondary | ICD-10-CM

## 2012-08-17 LAB — CBC WITH DIFFERENTIAL/PLATELET
Basophils Relative: 0 % (ref 0–1)
Eosinophils Absolute: 0.1 10*3/uL (ref 0.0–0.7)
HCT: 30.3 % — ABNORMAL LOW (ref 36.0–46.0)
Hemoglobin: 9.5 g/dL — ABNORMAL LOW (ref 12.0–15.0)
Lymphs Abs: 2.9 10*3/uL (ref 0.7–4.0)
MCH: 24.9 pg — ABNORMAL LOW (ref 26.0–34.0)
MCHC: 31.4 g/dL (ref 30.0–36.0)
MCV: 79.3 fL (ref 78.0–100.0)
Monocytes Absolute: 1 10*3/uL (ref 0.1–1.0)
Neutro Abs: 8.6 10*3/uL — ABNORMAL HIGH (ref 1.7–7.7)
Neutrophils Relative %: 68 % (ref 43–77)

## 2012-08-17 LAB — COMPREHENSIVE METABOLIC PANEL
BUN: 9 mg/dL (ref 6–23)
Calcium: 9.5 mg/dL (ref 8.4–10.5)
GFR calc Af Amer: >90 mL/min (ref >90)
Glucose, Bld: 87 mg/dL (ref 70–99)
Total Protein: 7.8 g/dL (ref 6.0–8.3)

## 2012-08-17 LAB — LIPASE, BLOOD: Lipase: 19 U/L (ref 11–59)

## 2012-08-17 MED ORDER — SODIUM CHLORIDE 0.9 % IV SOLN: INTRAVENOUS | Status: DC

## 2012-08-17 MED ORDER — ONDANSETRON HCL 4 MG/2ML IJ SOLN
4.0000 mg | INTRAMUSCULAR | Status: DC | PRN
  Administered 2012-08-17: 4 mg via INTRAVENOUS
  Filled 2012-08-17: qty 2

## 2012-08-17 MED ORDER — SODIUM CHLORIDE 0.9 % IV SOLN
INTRAVENOUS | Status: DC
  Administered 2012-08-17: 15:00:00 via INTRAVENOUS

## 2012-08-17 MED ORDER — PIPERACILLIN-TAZOBACTAM 3.375 G IVPB
3.3750 g | Freq: Three times a day (TID) | INTRAVENOUS | Status: DC
  Administered 2012-08-18 (×2): 3.375 g via INTRAVENOUS
  Filled 2012-08-17 (×11): qty 50

## 2012-08-17 MED ORDER — HYDROMORPHONE HCL PF 1 MG/ML IJ SOLN
1.0000 mg | Freq: Once | INTRAMUSCULAR | Status: AC
  Administered 2012-08-17: 1 mg via INTRAVENOUS
  Filled 2012-08-17: qty 1

## 2012-08-17 MED ORDER — PANTOPRAZOLE SODIUM 40 MG IV SOLR
40.0000 mg | Freq: Every day | INTRAVENOUS | Status: DC
  Administered 2012-08-17 – 2012-08-18 (×2): 40 mg via INTRAVENOUS
  Filled 2012-08-17 (×2): qty 40

## 2012-08-17 MED ORDER — MORPHINE SULFATE 4 MG/ML IJ SOLN
4.0000 mg | INTRAMUSCULAR | Status: DC | PRN
  Administered 2012-08-17: 4 mg via INTRAVENOUS
  Filled 2012-08-17 (×2): qty 1

## 2012-08-17 MED ORDER — HYDROMORPHONE HCL PF 1 MG/ML IJ SOLN
1.0000 mg | INTRAMUSCULAR | Status: DC | PRN
  Administered 2012-08-17: 2 mg via INTRAVENOUS
  Administered 2012-08-17: 1 mg via INTRAVENOUS
  Administered 2012-08-18: 2 mg via INTRAVENOUS
  Filled 2012-08-17 (×2): qty 2
  Filled 2012-08-17: qty 1

## 2012-08-17 MED ORDER — HYDROMORPHONE HCL PF 1 MG/ML IJ SOLN: 1.0000 mg | INTRAMUSCULAR | Status: DC | PRN

## 2012-08-17 MED ORDER — LACTATED RINGERS IV SOLN
INTRAVENOUS | Status: DC
  Administered 2012-08-17 – 2012-08-18 (×3): via INTRAVENOUS

## 2012-08-17 MED ORDER — ONDANSETRON HCL 4 MG/2ML IJ SOLN
4.0000 mg | Freq: Four times a day (QID) | INTRAMUSCULAR | Status: DC | PRN
  Administered 2012-08-18: 4 mg via INTRAVENOUS
  Filled 2012-08-17: qty 2

## 2012-08-17 MED ORDER — ENOXAPARIN SODIUM 40 MG/0.4ML ~~LOC~~ SOLN
40.0000 mg | SUBCUTANEOUS | Status: DC
  Administered 2012-08-17 – 2012-08-18 (×2): 40 mg via SUBCUTANEOUS
  Filled 2012-08-17 (×2): qty 0.4

## 2012-08-17 MED ORDER — ONDANSETRON HCL 4 MG/2ML IJ SOLN: 4.0000 mg | Freq: Three times a day (TID) | INTRAMUSCULAR | Status: DC | PRN

## 2012-08-17 NOTE — H&P (Signed)
Claire Bradley is an 42 y.o. female.   Chief Complaint: RUQ pain HPI: Patient presented to [facility] with several days of RUQ pain.  Some radiation to the back.  Worse with PO intake.  Initially increased with fatty foods.  Some nausea.  No emesis.  Subjective fever and chills.  No jaundice.  Some family history of biliary disease.  No melena.  NO hematochezia.  NO change with urination.  Similar but not as severe symptoms in the past.    Past Medical History  Diagnosis Date  . Cholelithiasis   . Kidney stones     Past Surgical History  Procedure Laterality Date  . Cesarean section    . Shoulder surgery    . Tubal ligation      History reviewed. No pertinent family history. Social History:  reports that she has been smoking Cigarettes.  She has been smoking about 0.25 packs per day. She has never used smokeless tobacco. She reports that she does not drink alcohol or use illicit drugs.  Allergies:  Allergies  Allergen Reactions  . Penicillins Nausea And Vomiting    Medications Prior to Admission  Medication Sig Dispense Refill  . ibuprofen (ADVIL,MOTRIN) 800 MG tablet Take 800 mg by mouth every 6 (six) hours as needed for pain.        Results for orders placed during the hospital encounter of 08/17/12 (from the past 48 hour(s))  CBC WITH DIFFERENTIAL     Status: Abnormal   Collection Time    08/17/12  2:40 PM      Result Value Range   WBC 12.6 (*) 4.0 - 10.5 K/uL   RBC 3.82 (*) 3.87 - 5.11 MIL/uL   Hemoglobin 9.5 (*) 12.0 - 15.0 g/dL   HCT 30.8 (*) 65.7 - 84.6 %   MCV 79.3  78.0 - 100.0 fL   MCH 24.9 (*) 26.0 - 34.0 pg   MCHC 31.4  30.0 - 36.0 g/dL   RDW 96.2 (*) 95.2 - 84.1 %   Platelets 417 (*) 150 - 400 K/uL   Neutrophils Relative % 68  43 - 77 %   Lymphocytes Relative 23  12 - 46 %   Monocytes Relative 8  3 - 12 %   Eosinophils Relative 1  0 - 5 %   Basophils Relative 0  0 - 1 %   Neutro Abs 8.6 (*) 1.7 - 7.7 K/uL   Lymphs Abs 2.9  0.7 - 4.0 K/uL   Monocytes  Absolute 1.0  0.1 - 1.0 K/uL   Eosinophils Absolute 0.1  0.0 - 0.7 K/uL   Basophils Absolute 0.0  0.0 - 0.1 K/uL   WBC Morphology ATYPICAL LYMPHOCYTES     Comment: ATYPICAL MONONUCLEAR CELLS  COMPREHENSIVE METABOLIC PANEL     Status: Abnormal   Collection Time    08/17/12  2:40 PM      Result Value Range   Sodium 138  135 - 145 mEq/L   Potassium 3.7  3.5 - 5.1 mEq/L   Chloride 102  96 - 112 mEq/L   CO2 24  19 - 32 mEq/L   Glucose, Bld 87  70 - 99 mg/dL   BUN 9  6 - 23 mg/dL   Creatinine, Ser 3.24  0.50 - 1.10 mg/dL   Calcium 9.5  8.4 - 40.1 mg/dL   Total Protein 7.8  6.0 - 8.3 g/dL   Albumin 4.2  3.5 - 5.2 g/dL   AST 12  0 - 37  U/L   ALT 7  0 - 35 U/L   Alkaline Phosphatase 60  39 - 117 U/L   Total Bilirubin 0.2 (*) 0.3 - 1.2 mg/dL   GFR calc non Af Amer >90  >90 mL/min   GFR calc Af Amer >90  >90 mL/min   Comment:            The eGFR has been calculated     using the CKD EPI equation.     This calculation has not been     validated in all clinical     situations.     eGFR's persistently     <90 mL/min signify     possible Chronic Kidney Disease.  LIPASE, BLOOD     Status: None   Collection Time    08/17/12  2:40 PM      Result Value Range   Lipase 19  11 - 59 U/L   Dg Chest 2 View  08/17/2012   *RADIOLOGY REPORT*  Clinical Data: Chest pain.  CHEST - 2 VIEW  Comparison: 02/16/2006  Findings: Left lower lobe linear subsegmental atelectasis.  Lungs otherwise clear.  Exam otherwise normal.  IMPRESSION:  1.  Left basilar subsegmental atelectasis.  Otherwise negative.   Original Report Authenticated By: Gaylyn Rong, M.D.   US Abdomen Limited Ruq  08/17/2012   *RADIOLOGY REPORT*  Clinical Data:  Right upper quadrant abdominal pain.  LIMITED ABDOMINAL ULTRASOUND - RIGHT UPPER QUADRANT  Comparison:  07/28/2012  Findings:  Gallbladder:  2.9 cm gallstone in the gallbladder noted with surrounding sludge.  No gallbladder wall thickening.  Sonographic Murphy's sign present.   Common bile duct:  Measures 5 mm in diameter, borderline prominent.  Liver:  Unremarkable.  IMPRESSION:  1.  Cholelithiasis.  Extensive sludge in gallbladder.  Sonographic Murphy's sign present.  Correlate clinically in assessing for acute cholecystitis. 2.  Borderline prominence CBD at 5 mm.                    Original Report Authenticated By: Gaylyn Rong, M.D.    Review of Systems  Constitutional: Positive for fever and chills. Negative for weight loss and malaise/fatigue.  HENT: Negative.   Eyes: Negative.   Respiratory: Negative.   Cardiovascular: Negative.   Gastrointestinal: Positive for nausea and abdominal pain (RUQ pain.). Negative for vomiting, diarrhea, constipation, blood in stool and melena.  Genitourinary: Negative.   Musculoskeletal: Negative.   Skin: Negative.   Neurological: Negative.   Endo/Heme/Allergies: Negative.   Psychiatric/Behavioral: Negative.     Blood pressure 108/73, pulse 76, temperature 98.2 F (36.8 C), temperature source Oral, resp. rate 18, height 5\' 5"  (1.651 m), weight 95.3 kg (210 lb 1.6 oz), last menstrual period 07/28/2012, SpO2 98.00%. Physical Exam  Constitutional: She is oriented to person, place, and time. She appears well-developed and well-nourished. No distress.  obese  HENT:  Head: Normocephalic and atraumatic.  Eyes: Conjunctivae and EOM are normal. Pupils are equal, round, and reactive to light. No scleral icterus.  Neck: Normal range of motion. Neck supple. No tracheal deviation present. No thyromegaly present.  Cardiovascular: Normal rate, regular rhythm and normal heart sounds.   Respiratory: Effort normal and breath sounds normal. No respiratory distress.  GI: Soft. Bowel sounds are normal. She exhibits no distension and no mass. There is tenderness (RUQ pain.  +Murphy's sign.  No diffuse peritoneal signs.). There is no rebound and no guarding.  Lymphadenopathy:    She has no cervical adenopathy.  Neurological: She  is alert  and oriented to person, place, and time.  Skin: Skin is warm and dry.     Assessment/Plan Acute cholecystitis.  Admit.  Continue NPO.  Continue IV fluid.  Continue IV abx.  DVT prophylaxis.  Possible OR tomorrow.    Tannis Burstein C 08/17/2012, 10:08 PM

## 2012-08-17 NOTE — ED Notes (Signed)
Pt reports having come to the ED within the last 3 weeks with diagnosis of kidney and gall stones. Pt states she has been having right upper quadrant pain since that visit, but the pain was worse this morning. Pt also reports swelling in the right upper quadrant.

## 2012-08-17 NOTE — ED Provider Notes (Signed)
History     CSN: [id number]  Arrival date & time 08/17/12  1229   First MD Initiated Contact with Patient 08/17/12 1337      Chief Complaint  Patient presents with  . Abdominal Pain     HPI Pt was seen at 1345.   Per pt, c/o gradual onset and persistence of constant RUQ abd "pain" for the past 3 weeks, worse since this morning.  Has been associated with multiple intermittent episodes of N/V.  Describes the abd pain as "cramping."  States she was evaluated in the ED for same 3 weeks ago, and was dx with "gallstones."  Denies diarrhea, no fevers, no back pain, no rash, no CP/SOB, no black or blood in stools or emesis.       Past Medical History  Diagnosis Date  . Cholelithiasis   . Kidney stones     Past Surgical History  Procedure Laterality Date  . Cesarean section    . Shoulder surgery    . Tubal ligation       History  Substance Use Topics  . Smoking status: Current Every Day Smoker -- 0.25 packs/day    Types: Cigarettes  . Smokeless tobacco: Never Used  . Alcohol Use: No      Review of Systems ROS: Statement: All systems negative except as marked or noted in the HPI; Constitutional: Negative for fever and chills. ; ; Eyes: Negative for eye pain, redness and discharge. ; ; ENMT: Negative for ear pain, hoarseness, nasal congestion, sinus pressure and sore throat. ; ; Cardiovascular: Negative for chest pain, palpitations, diaphoresis, dyspnea and peripheral edema. ; ; Respiratory: Negative for cough, wheezing and stridor. ; ; Gastrointestinal: +N/V, abd pain. Negative for diarrhea, blood in stool, hematemesis, jaundice and rectal bleeding. . ; ; Genitourinary: Negative for dysuria, flank pain and hematuria. ; ; GYN:  No vaginal bleeding, no vaginal discharge, no vulvar pain.;; Musculoskeletal: Negative for back pain and neck pain. Negative for swelling and trauma.; ; Skin: Negative for pruritus, rash, abrasions, blisters, bruising and skin lesion.; ; Neuro: Negative for  headache, lightheadedness and neck stiffness. Negative for weakness, altered level of consciousness , altered mental status, extremity weakness, paresthesias, involuntary movement, seizure and syncope.       Allergies  Penicillins  Home Medications   Current Outpatient Rx  Name  Route  Sig  Dispense  Refill  . ibuprofen (ADVIL,MOTRIN) 800 MG tablet   Oral   Take 800 mg by mouth every 6 (six) hours as needed for pain.           BP 120/74  Pulse 104  Temp(Src) 98.7 F (37.1 C)  Resp 18  Ht 5\' 9"  (1.753 m)  Wt 207 lb 6 oz (94.065 kg)  BMI 30.61 kg/m2  SpO2 100%  LMP 07/28/2012  Physical Exam 1350: Physical examination:  Nursing notes reviewed; Vital signs and O2 SAT reviewed;  Constitutional: Well developed, Well nourished, Well hydrated, Uncomfortable appearing.; Head:  Normocephalic, atraumatic; Eyes: EOMI, PERRL, No scleral icterus; ENMT: Mouth and pharynx normal, Mucous membranes moist; Neck: Supple, Full range of motion, No lymphadenopathy; Cardiovascular: Regular rate and rhythm, No murmur, rub, or gallop; Respiratory: Breath sounds clear & equal bilaterally, No rales, rhonchi, wheezes.  Speaking full sentences with ease, Normal respiratory effort/excursion; Chest: Nontender, Movement normal; Abdomen: Soft, +RUQ tenderness to palp. No rebound. Nondistended, Normal bowel sounds; Genitourinary: No CVA tenderness; Extremities: Pulses normal, No tenderness, No edema, No calf edema or asymmetry.; Neuro: AA&Ox3, Major CN  grossly intact.  Speech clear. No gross focal motor or sensory deficits in extremities.; Skin: Color normal, Warm, Dry.   ED Course  Procedures     MDM  MDM Reviewed: previous chart, nursing note and vitals Reviewed previous: labs and CT scan Interpretation: labs and ultrasound   Results for orders placed during the hospital encounter of 08/17/12  CBC WITH DIFFERENTIAL      Result Value Range   WBC 12.6 (*) 4.0 - 10.5 K/uL   RBC 3.82 (*) 3.87 - 5.11  MIL/uL   Hemoglobin 9.5 (*) 12.0 - 15.0 g/dL   HCT 16.1 (*) 09.6 - 04.5 %   MCV 79.3  78.0 - 100.0 fL   MCH 24.9 (*) 26.0 - 34.0 pg   MCHC 31.4  30.0 - 36.0 g/dL   RDW 40.9 (*) 81.1 - 91.4 %   Platelets 417 (*) 150 - 400 K/uL   Neutrophils Relative % 68  43 - 77 %   Lymphocytes Relative 23  12 - 46 %   Monocytes Relative 8  3 - 12 %   Eosinophils Relative 1  0 - 5 %   Basophils Relative 0  0 - 1 %   Neutro Abs 8.6 (*) 1.7 - 7.7 K/uL   Lymphs Abs 2.9  0.7 - 4.0 K/uL   Monocytes Absolute 1.0  0.1 - 1.0 K/uL   Eosinophils Absolute 0.1  0.0 - 0.7 K/uL   Basophils Absolute 0.0  0.0 - 0.1 K/uL   WBC Morphology ATYPICAL LYMPHOCYTES    COMPREHENSIVE METABOLIC PANEL      Result Value Range   Sodium 138  135 - 145 mEq/L   Potassium 3.7  3.5 - 5.1 mEq/L   Chloride 102  96 - 112 mEq/L   CO2 24  19 - 32 mEq/L   Glucose, Bld 87  70 - 99 mg/dL   BUN 9  6 - 23 mg/dL   Creatinine, Ser 7.82  0.50 - 1.10 mg/dL   Calcium 9.5  8.4 - 95.6 mg/dL   Total Protein 7.8  6.0 - 8.3 g/dL   Albumin 4.2  3.5 - 5.2 g/dL   AST 12  0 - 37 U/L   ALT 7  0 - 35 U/L   Alkaline Phosphatase 60  39 - 117 U/L   Total Bilirubin 0.2 (*) 0.3 - 1.2 mg/dL   GFR calc non Af Amer >90  >90 mL/min   GFR calc Af Amer >90  >90 mL/min  LIPASE, BLOOD      Result Value Range   Lipase 19  11 - 59 U/L   Dg Chest 2 View  08/17/2012   *RADIOLOGY REPORT*  Clinical Data: Chest pain.  CHEST - 2 VIEW  Comparison: 02/16/2006  Findings: Left lower lobe linear subsegmental atelectasis.  Lungs otherwise clear.  Exam otherwise normal.  IMPRESSION:  1.  Left basilar subsegmental atelectasis.  Otherwise negative.   Original Report Authenticated By: Gaylyn Rong, M.D.    US Abdomen Limited Ruq 08/17/2012   *RADIOLOGY REPORT*  Clinical Data:  Right upper quadrant abdominal pain.  LIMITED ABDOMINAL ULTRASOUND - RIGHT UPPER QUADRANT  Comparison:  07/28/2012  Findings:  Gallbladder:  2.9 cm gallstone in the gallbladder noted with surrounding  sludge.  No gallbladder wall thickening.  Sonographic Murphy's sign present.  Common bile duct:  Measures 5 mm in diameter, borderline prominent.  Liver:  Unremarkable.  IMPRESSION:  1.  Cholelithiasis.  Extensive sludge in gallbladder.  Sonographic Murphy's sign  present.  Correlate clinically in assessing for acute cholecystitis. 2.  Borderline prominence CBD at 5 mm.                    Original Report Authenticated By: Gaylyn Rong, M.D.     Results for LAIBA, FUERTE (MRN [id number]) as of 08/17/2012 16:13  Ref. Range 02/21/2010 14:31 09/22/2011 21:36 07/28/2012 16:45 08/17/2012 14:40  WBC Latest Range: 4.0-10.5 K/uL 10.1 15.3 (H) 11.8 (H) 12.6 (H)  Hemoglobin Latest Range: 12.0-15.0 g/dL 8.3 (L) 8.2 (L) 8.6 (L) 9.5 (L)  HCT Latest Range: 36.0-46.0 % 26.3 (L) 26.9 (L) 27.1 (L) 30.3 (L)      1600:  Pt continues to c/o RUQ abd pain despite multiple doses of IV pain meds.  No vomiting or stooling while in the ED. Mild leukocytosis, normal LFT's. [place] questions borderline prominence CBD with +sonographic Murphy's sign. Remains afebrile with stable VS. Dx and testing d/w pt.  Questions answered.  Verb understanding, agreeable to admit.  T/C to General Surgeon Dr. Leticia Penna, case discussed, including:  HPI, pertinent PM/SHx, VS/PE, dx testing, ED course and treatment:  Agreeable to admit, requests he will come to ED for eval.        Laray Anger, DO 08/19/12 1231

## 2012-08-18 ENCOUNTER — Encounter (HOSPITAL_COMMUNITY): Payer: Self-pay | Admitting: *Deleted

## 2012-08-18 ENCOUNTER — Encounter (HOSPITAL_COMMUNITY): Payer: Self-pay | Admitting: Anesthesiology

## 2012-08-18 ENCOUNTER — Encounter (HOSPITAL_COMMUNITY)
Admission: EM | Disposition: A | Discharge status: Home / Self Care | Payer: Self-pay | Source: Home / Self Care | Attending: General Surgery

## 2012-08-18 ENCOUNTER — Inpatient Hospital Stay (HOSPITAL_COMMUNITY): Payer: Self-pay | Admitting: Anesthesiology

## 2012-08-18 HISTORY — PX: CHOLECYSTECTOMY: SHX55

## 2012-08-18 LAB — COMPREHENSIVE METABOLIC PANEL
CO2: 25 mEq/L (ref 19–32)
Calcium: 8.8 mg/dL (ref 8.4–10.5)
Creatinine, Ser: 0.65 mg/dL (ref 0.50–1.10)
GFR calc Af Amer: >90 mL/min (ref >90)
GFR calc non Af Amer: >90 mL/min (ref >90)
Glucose, Bld: 84 mg/dL (ref 70–99)

## 2012-08-18 LAB — PREPARE RBC (CROSSMATCH)

## 2012-08-18 LAB — CBC
Hemoglobin: 7.9 g/dL — ABNORMAL LOW (ref 12.0–15.0)
RBC: 3.23 MIL/uL — ABNORMAL LOW (ref 3.87–5.11)

## 2012-08-18 LAB — MRSA PCR SCREENING: MRSA by PCR: NEGATIVE

## 2012-08-18 SURGERY — LAPAROSCOPIC CHOLECYSTECTOMY
Anesthesia: General | Wound class: Contaminated

## 2012-08-18 MED ORDER — FENTANYL CITRATE 0.05 MG/ML IJ SOLN
INTRAMUSCULAR | Status: DC | PRN
  Administered 2012-08-18 (×6): 50 ug via INTRAVENOUS

## 2012-08-18 MED ORDER — FENTANYL CITRATE 0.05 MG/ML IJ SOLN
25.0000 ug | INTRAMUSCULAR | Status: DC | PRN
  Administered 2012-08-18 (×2): 50 ug via INTRAVENOUS

## 2012-08-18 MED ORDER — PROPOFOL 10 MG/ML IV BOLUS
INTRAVENOUS | Status: DC | PRN
  Administered 2012-08-18: 150 mg via INTRAVENOUS

## 2012-08-18 MED ORDER — PROPOFOL 10 MG/ML IV EMUL
INTRAVENOUS | Status: AC
  Filled 2012-08-18: qty 20

## 2012-08-18 MED ORDER — GLYCOPYRROLATE 0.2 MG/ML IJ SOLN
0.2000 mg | Freq: Once | INTRAMUSCULAR | Status: AC
  Administered 2012-08-18: 0.2 mg via INTRAVENOUS

## 2012-08-18 MED ORDER — ONDANSETRON HCL 4 MG/2ML IJ SOLN: 4.0000 mg | Freq: Once | INTRAMUSCULAR | Status: DC | PRN

## 2012-08-18 MED ORDER — BUPIVACAINE HCL (PF) 0.5 % IJ SOLN
INTRAMUSCULAR | Status: DC | PRN
  Administered 2012-08-18: 10 mL

## 2012-08-18 MED ORDER — LIDOCAINE HCL (PF) 1 % IJ SOLN
INTRAMUSCULAR | Status: AC
  Filled 2012-08-18: qty 5

## 2012-08-18 MED ORDER — BUPIVACAINE HCL (PF) 0.5 % IJ SOLN
INTRAMUSCULAR | Status: AC
  Filled 2012-08-18: qty 30

## 2012-08-18 MED ORDER — HYDROCODONE-ACETAMINOPHEN 5-325 MG PO TABS
1.0000 | ORAL_TABLET | ORAL | Status: DC | PRN
  Administered 2012-08-18: 2 via ORAL
  Filled 2012-08-18: qty 2

## 2012-08-18 MED ORDER — MIDAZOLAM HCL 2 MG/2ML IJ SOLN
INTRAMUSCULAR | Status: AC
  Filled 2012-08-18: qty 2

## 2012-08-18 MED ORDER — LIDOCAINE HCL (CARDIAC) 20 MG/ML IV SOLN
INTRAVENOUS | Status: DC | PRN
  Administered 2012-08-18: 40 mg via INTRAVENOUS

## 2012-08-18 MED ORDER — GLYCOPYRROLATE 0.2 MG/ML IJ SOLN
INTRAMUSCULAR | Status: AC
  Filled 2012-08-18: qty 1

## 2012-08-18 MED ORDER — ONDANSETRON HCL 4 MG/2ML IJ SOLN
4.0000 mg | Freq: Once | INTRAMUSCULAR | Status: AC
  Administered 2012-08-18: 4 mg via INTRAVENOUS

## 2012-08-18 MED ORDER — LACTATED RINGERS IV SOLN
INTRAVENOUS | Status: DC
  Administered 2012-08-18: 12:00:00 via INTRAVENOUS

## 2012-08-18 MED ORDER — ONDANSETRON HCL 4 MG/2ML IJ SOLN
INTRAMUSCULAR | Status: AC
  Filled 2012-08-18: qty 2

## 2012-08-18 MED ORDER — NEOSTIGMINE METHYLSULFATE 1 MG/ML IJ SOLN
INTRAMUSCULAR | Status: DC | PRN
  Administered 2012-08-18: 2.5 mg via INTRAVENOUS

## 2012-08-18 MED ORDER — FENTANYL CITRATE 0.05 MG/ML IJ SOLN
INTRAMUSCULAR | Status: AC
  Filled 2012-08-18: qty 2

## 2012-08-18 MED ORDER — SODIUM CHLORIDE 0.9 % IR SOLN
Status: DC | PRN
  Administered 2012-08-18: 10 mL

## 2012-08-18 MED ORDER — CELECOXIB 100 MG PO CAPS
200.0000 mg | ORAL_CAPSULE | Freq: Two times a day (BID) | ORAL | Status: DC
  Administered 2012-08-19: 200 mg via ORAL
  Filled 2012-08-18 (×6): qty 2

## 2012-08-18 MED ORDER — GLYCOPYRROLATE 0.2 MG/ML IJ SOLN
INTRAMUSCULAR | Status: AC
  Filled 2012-08-18: qty 2

## 2012-08-18 MED ORDER — FENTANYL CITRATE 0.05 MG/ML IJ SOLN
INTRAMUSCULAR | Status: AC
  Filled 2012-08-18: qty 5

## 2012-08-18 MED ORDER — HEMOSTATIC AGENTS (NO CHARGE) OPTIME
TOPICAL | Status: DC | PRN
  Administered 2012-08-18: 1 via TOPICAL

## 2012-08-18 MED ORDER — ROCURONIUM BROMIDE 100 MG/10ML IV SOLN
INTRAVENOUS | Status: DC | PRN
  Administered 2012-08-18: 10 mg via INTRAVENOUS
  Administered 2012-08-18: 5 mg via INTRAVENOUS
  Administered 2012-08-18: 25 mg via INTRAVENOUS

## 2012-08-18 MED ORDER — ROCURONIUM BROMIDE 50 MG/5ML IV SOLN
INTRAVENOUS | Status: AC
  Filled 2012-08-18: qty 1

## 2012-08-18 MED ORDER — MIDAZOLAM HCL 2 MG/2ML IJ SOLN
1.0000 mg | INTRAMUSCULAR | Status: DC | PRN
  Administered 2012-08-18: 2 mg via INTRAVENOUS

## 2012-08-18 MED ORDER — HYDROMORPHONE HCL PF 1 MG/ML IJ SOLN
1.0000 mg | INTRAMUSCULAR | Status: DC | PRN
  Administered 2012-08-18 – 2012-08-19 (×3): 1 mg via INTRAVENOUS
  Filled 2012-08-18 (×3): qty 1

## 2012-08-18 MED ORDER — GLYCOPYRROLATE 0.2 MG/ML IJ SOLN
INTRAMUSCULAR | Status: DC | PRN
  Administered 2012-08-18: .5 mg via INTRAVENOUS

## 2012-08-18 SURGICAL SUPPLY — 39 items
APPLIER CLIP UNV 5X34 EPIX (ENDOMECHANICALS) ×2 IMPLANT
BAG HAMPER (MISCELLANEOUS) ×2 IMPLANT
BENZOIN TINCTURE PRP APPL 2/3 (GAUZE/BANDAGES/DRESSINGS) ×2 IMPLANT
CLOTH BEACON ORANGE TIMEOUT ST (SAFETY) ×2 IMPLANT
COVER LIGHT HANDLE STERIS (MISCELLANEOUS) ×4 IMPLANT
DECANTER SPIKE VIAL GLASS SM (MISCELLANEOUS) ×2 IMPLANT
DEVICE TROCAR PUNCTURE CLOSURE (ENDOMECHANICALS) ×2 IMPLANT
DURAPREP 26ML APPLICATOR (WOUND CARE) ×2 IMPLANT
ELECT REM PT RETURN 9FT ADLT (ELECTROSURGICAL) ×2
ELECTRODE REM PT RTRN 9FT ADLT (ELECTROSURGICAL) ×1 IMPLANT
FILTER SMOKE EVAC LAPAROSHD (FILTER) ×2 IMPLANT
FORMALIN 10 PREFIL 120ML (MISCELLANEOUS) ×2 IMPLANT
GLOVE BIOGEL PI IND STRL 7.0 (GLOVE) ×3 IMPLANT
GLOVE BIOGEL PI IND STRL 7.5 (GLOVE) ×1 IMPLANT
GLOVE BIOGEL PI INDICATOR 7.0 (GLOVE) ×3
GLOVE BIOGEL PI INDICATOR 7.5 (GLOVE) ×1
GLOVE ECLIPSE 6.5 STRL STRAW (GLOVE) ×2 IMPLANT
GLOVE ECLIPSE 7.0 STRL STRAW (GLOVE) ×2 IMPLANT
GLOVE SS BIOGEL STRL SZ 6.5 (GLOVE) ×1 IMPLANT
GLOVE SUPERSENSE BIOGEL SZ 6.5 (GLOVE) ×1
GOWN STRL REIN XL XLG (GOWN DISPOSABLE) ×6 IMPLANT
HEMOSTAT SNOW SURGICEL 2X4 (HEMOSTASIS) ×2 IMPLANT
INST SET LAPROSCOPIC AP (KITS) ×2 IMPLANT
IV NS IRRIG 3000ML ARTHROMATIC (IV SOLUTION) ×2 IMPLANT
KIT ROOM TURNOVER APOR (KITS) ×2 IMPLANT
MANIFOLD NEPTUNE II (INSTRUMENTS) ×2 IMPLANT
NEEDLE INSUFFLATION 14GA 120MM (NEEDLE) ×2 IMPLANT
PACK LAP CHOLE LZT030E (CUSTOM PROCEDURE TRAY) ×2 IMPLANT
PAD ARMBOARD 7.5X6 YLW CONV (MISCELLANEOUS) ×2 IMPLANT
POUCH SPECIMEN RETRIEVAL 10MM (ENDOMECHANICALS) ×2 IMPLANT
SET BASIN LINEN APH (SET/KITS/TRAYS/PACK) ×2 IMPLANT
SET TUBE IRRIG SUCTION NO TIP (IRRIGATION / IRRIGATOR) ×2 IMPLANT
SLEEVE Z-THREAD 5X100MM (TROCAR) ×4 IMPLANT
STRIP CLOSURE SKIN 1/2X4 (GAUZE/BANDAGES/DRESSINGS) ×2 IMPLANT
SUT MNCRL AB 4-0 PS2 18 (SUTURE) ×4 IMPLANT
SUT VIC AB 2-0 CT2 27 (SUTURE) ×2 IMPLANT
TROCAR Z-THRD FIOS HNDL 11X100 (TROCAR) ×2 IMPLANT
TROCAR Z-THREAD FIOS 5X100MM (TROCAR) ×2 IMPLANT
WARMER LAPAROSCOPE (MISCELLANEOUS) ×2 IMPLANT

## 2012-08-18 NOTE — Transfer of Care (Signed)
Immediate Anesthesia Transfer of Care Note  Patient: Claire Bradley  Procedure(s) Performed: Procedure(s): LAPAROSCOPIC CHOLECYSTECTOMY (N/A)  Patient Location: PACU  Anesthesia Type:General  Level of Consciousness: awake  Airway & Oxygen Therapy: Patient Spontanous Breathing and Patient connected to face mask oxygen  Post-op Assessment: Report given to PACU RN  Post vital signs: Reviewed and stable  Complications: No apparent anesthesia complications

## 2012-08-18 NOTE — Op Note (Signed)
Patient:  Claire Bradley  DOB:  04/02/1971  MRN:  [id number]   Preop Diagnosis:  Acute cholecystitis  Postop Diagnosis:  The same  Procedure:  Lap scopic cholecystectomy  Surgeon:  Dr. Tilford Pillar  Anes:  General endotracheal, 0.5% Sensorcaine plain for local  Indications:  Patient is a 42 year old female presented to [facility] for right or quadrant bowel pain consistent for acute cholecystitis. Workup and evaluation was consistent for acute cholecystitis. Risks benefits alternatives a laparoscopic possible open cholecystectomy were discussed at length patient including but not limited to risk of bleeding, infection, bile leak, small bowel injury, common duct injury as well as intraoperative cardiac and pulmonary events. Patient was consented for the planned procedure  Procedure note:  Patient was taken to the operating room was placed in supine position on the or table time the general anesthetic is a Optician, dispensing. Once patient was asleep she symmetrically intubated by the nurse anesthetist. At this point her abdomen is prepped with DuraPrep solution and draped in standard fashion. Time out was performed. Stab incision was created supraumbilically with 11 blade scalpel additional dissection down to subcuticular tissue carried out using a Coker clamp was utilized grasp the anterior normal fascia and lift this anteriorly. A Veress needle is inserted saline drop test is utilized confirm intraperitoneal placement. At this time pneumoperitoneum was initiated and once sufficient pneumoperitoneum was obtained a 11 mm insert overlap scope allowing visualization of the trocar entering into the peritoneal cavity. At this time the inner cannulas removed the laparoscope was reinserted there is no evidence of any trocar or Veress needle placement injury. At this time the remaining trochars replaced a 5 mm in the gastrin, 5 mm in the midline, and a 5 mm in the right lateral wall. Patient's placed into  a reverse Trendelenburg left lateral disposition. Some omental adhesions onto the gallbladder fundus and body were bluntly stripped using a Art gallery manager. The fundus of the gallbladder was too tight to be grasped a regular grasper and so I brought a Weck needle to the field to aspirate the contents of the gallbladder. This allowed for adequate decompression of the gallbladder to elevate the fundus of the gallbladder up and over the right lobe liver. At this time I carefully dissected the peritoneal reflection off the infundibulum using a [place] dissector and a suction irrigator to expose both the cystic duct and cystic artery as they enter into the infundibulum. A window was created by both the structures. Fornical 2 placed proximally one distally and the cystic duct and the cystic duct was divided between 2 most distal clips. Similarly 2 endoclips placed proximally and one distally and the cystic artery and cystic arteries divided between 2 most is a clips. At this time a letter cautery utilized dissect the gallbladder free from the gallbladder fossa. Once the gallbladder is free is placed in Endo Catch bag and placed up and over the right lobe the liver. To facilitate this the 10 mm scope is exchanged for a 5 mm scope. The gallbladder fossa and was inspected. Hemostasis was good however due to the raw nature the gallbladder fossa I did opt to place a piece of Surgicel snow into the gallbladder fossa after copiously irrigate the surgical field. The endoclips were inspected there is no evidence of any bleeding or bile leak. At this time I turned my attention to closure.  Using an Endo Close suture passing device a 2-0 Vicryl sutures passed to the umbilical trocar site. With this  suture and placed the gallbladder was retrieved was removed through the umbilical trocar site and intact Endo Catch bag. Some blunt dilatation was required to adequately dilate the trocar site to remove the gallbladder. The  gallbladder was sent as a perm specimen to pathology. At this time the pneumoperitoneum was evacuated. The trochars were removed. The Vicryl sutures secured. Local anesthetic was instilled. The skin edges at all 4 trocar sites was reapproximated using a 4-0 Monocryl. The skin was washed dried moist dry towel. Benzoin is applied her incisions. Half-inch are suture placed. The drapes removed patient left come out of general anesthetic. She was transferred to the PACU in stable condition. At the conclusion of procedure all instrument, sponge, needle counts are correct. Patient tolerated procedure extremely well.  Complications:  None apparent  EBL:  Less than 100 mL  Specimen:  Gallbladder

## 2012-08-18 NOTE — Care Management Note (Unsigned)
    Page 1 of 1   08/18/2012     10:50:00 AM   CARE MANAGEMENT NOTE 08/18/2012  Patient:  Claire Bradley, Claire Bradley   Account Number:  [id number]  Date Initiated:  08/18/2012  Documentation initiated by:  Sharrie Rothman  Subjective/Objective Assessment:   Pt admitted from home with acute choleycystitis. Pt lives with her sons and will return home at discharge. Pt is currently working at [employer] and will be eligible for insurance in 90 days. Pt is independent with ADL's.     Action/Plan:   Pt will need help with meds at discharge. Voucher will be given to pt. No other CM needs noted. Financial counselor is aware of pts self pay status.   Anticipated DC Date:  08/21/2012   Anticipated DC Plan:  HOME/SELF CARE  In-house referral  Financial Counselor      DC Planning Services  CM consult  MATCH Program      Choice offered to / List presented to:             Status of service:  In process, will continue to follow Medicare Important Message given?   (If response is "NO", the following Medicare IM given date fields will be blank) Date Medicare IM given:   Date Additional Medicare IM given:    Discharge Disposition:    Per UR Regulation:    If discussed at Long Length of Stay Meetings, dates discussed:    Comments:  08/18/12 1050 Arlyss Queen, RN BSN CM

## 2012-08-18 NOTE — Anesthesia Postprocedure Evaluation (Signed)
  Anesthesia Post-op Note  Patient: Claire Bradley  Procedure(s) Performed: Procedure(s): LAPAROSCOPIC CHOLECYSTECTOMY (N/A)  Patient Location: PACU  Anesthesia Type:General  Level of Consciousness: awake  Airway and Oxygen Therapy: Patient Spontanous Breathing and Patient connected to face mask oxygen  Post-op Pain: none  Post-op Assessment: Post-op Vital signs reviewed, Patient's Cardiovascular Status Stable, Respiratory Function Stable, Patent Airway and No signs of Nausea or vomiting  Post-op Vital Signs: Reviewed and stable  Complications: No apparent anesthesia complications

## 2012-08-18 NOTE — Anesthesia Preprocedure Evaluation (Signed)
Anesthesia Evaluation  Patient identified by MRN, date of birth, ID band Patient awake    Reviewed: Allergy & Precautions, H&P , NPO status , Patient's Chart, lab work & pertinent test results  Airway Mallampati: I TM Distance: >3 FB Neck ROM: Full    Dental  (+) Teeth Intact   Pulmonary Current Smoker,    Pulmonary exam normal       Cardiovascular negative cardio ROS  Rhythm:Regular Rate:Normal     Neuro/Psych negative neurological ROS  negative psych ROS   GI/Hepatic negative GI ROS, Neg liver ROS,   Endo/Other  negative endocrine ROS  Renal/GU Renal disease     Musculoskeletal negative musculoskeletal ROS (+)   Abdominal Normal abdominal exam  (+)   Peds  Hematology negative hematology ROS (+)   Anesthesia Other Findings Chronic hoarseness since childhood--never evaluated.  Also has chronic anemia with history heavy menorrhagia.  Reproductive/Obstetrics negative OB ROS                           Anesthesia Physical Anesthesia Plan  ASA: II  Anesthesia Plan: General   Post-op Pain Management:    Induction: Intravenous  Airway Management Planned: Oral ETT  Additional Equipment:   Intra-op Plan:   Post-operative Plan: Extubation in OR  Informed Consent: I have reviewed the patients History and Physical, chart, labs and discussed the procedure including the risks, benefits and alternatives for the proposed anesthesia with the patient or authorized representative who has indicated his/her understanding and acceptance.     Plan Discussed with: CRNA  Anesthesia Plan Comments: (Upon laryngoscopy note if any vocal cord abnormality is apparent.)        Anesthesia Quick Evaluation

## 2012-08-18 NOTE — Anesthesia Procedure Notes (Signed)
Procedure Name: Intubation Date/Time: 08/18/2012 1:04 PM Performed by: Glynn Octave E Pre-anesthesia Checklist: Patient identified, Patient being monitored, Timeout performed, Emergency Drugs available and Suction available Patient Re-evaluated:Patient Re-evaluated prior to inductionOxygen Delivery Method: Circle System Utilized Preoxygenation: Pre-oxygenation with 100% oxygen Intubation Type: IV induction, Rapid sequence and Cricoid Pressure applied Ventilation: Mask ventilation without difficulty Laryngoscope Size: Mac and 3 Grade View: Grade I Tube type: Oral Tube size: 7.0 mm Number of attempts: 1 Airway Equipment and Method: stylet Placement Confirmation: ETT inserted through vocal cords under direct vision,  positive ETCO2 and breath sounds checked- equal and bilateral Secured at: 21 cm Tube secured with: Tape Dental Injury: Teeth and Oropharynx as per pre-operative assessment

## 2012-08-18 NOTE — Progress Notes (Signed)
UR chart review completed.  

## 2012-08-18 NOTE — Progress Notes (Signed)
Day of Surgery  Subjective: Feels a little better today. Still with right upper abdominal pain  Objective: Vital signs in last 24 hours: Temp:  [98 F (36.7 C)-98.7 F (37.1 C)] 98 F (36.7 C) (05/16 1121) Pulse Rate:  [53-104] 53 (05/16 1017) Resp:  [18-20] 20 (05/16 1017) BP: (92-120)/(53-74) 106/53 mmHg (05/16 1017) SpO2:  [96 %-100 %] 99 % (05/16 1017) Weight:  [86.183 kg (190 lb)-95.3 kg (210 lb 1.6 oz)] 86.183 kg (190 lb) (05/16 1121) Last BM Date: 08/17/12  Intake/Output from previous day: 05/15 0701 - 05/16 0700 In: 582.7 [I.V.:582.7] Out: -  Intake/Output this shift:    General appearance: alert and no distress GI: Soft, expected right upper quadrant tenderness. No diffuse peritoneal signs  Lab Results:   Recent Labs  08/17/12 1440 08/18/12 0447  WBC 12.6* 9.2  HGB 9.5* 7.9*  HCT 30.3* 25.6*  PLT 417* 362   BMET  Recent Labs  08/17/12 1440 08/18/12 0447  NA 138 138  K 3.7 3.9  CL 102 105  CO2 24 25  GLUCOSE 87 84  BUN 9 7  CREATININE 0.60 0.65  CALCIUM 9.5 8.8   PT/INR No results found for this basename: LABPROT, INR,  in the last 72 hours ABG No results found for this basename: PHART, PCO2, PO2, HCO3,  in the last 72 hours  Studies/Results: Dg Chest 2 View  08/17/2012   *RADIOLOGY REPORT*  Clinical Data: Chest pain.  CHEST - 2 VIEW  Comparison: 02/16/2006  Findings: Left lower lobe linear subsegmental atelectasis.  Lungs otherwise clear.  Exam otherwise normal.  IMPRESSION:  1.  Left basilar subsegmental atelectasis.  Otherwise negative.   Original Report Authenticated By: Gaylyn Rong, M.D.   US Abdomen Limited Ruq  08/17/2012   *RADIOLOGY REPORT*  Clinical Data:  Right upper quadrant abdominal pain.  LIMITED ABDOMINAL ULTRASOUND - RIGHT UPPER QUADRANT  Comparison:  07/28/2012  Findings:  Gallbladder:  2.9 cm gallstone in the gallbladder noted with surrounding sludge.  No gallbladder wall thickening.  Sonographic Murphy's sign present.   Common bile duct:  Measures 5 mm in diameter, borderline prominent.  Liver:  Unremarkable.  IMPRESSION:  1.  Cholelithiasis.  Extensive sludge in gallbladder.  Sonographic Murphy's sign present.  Correlate clinically in assessing for acute cholecystitis. 2.  Borderline prominence CBD at 5 mm.                    Original Report Authenticated By: Gaylyn Rong, M.D.    Anti-infectives: Anti-infectives   Start     Dose/Rate Route Frequency Ordered Stop   08/17/12 1715  piperacillin-tazobactam (ZOSYN) IVPB 3.375 g     3.375 g 12.5 mL/hr over 240 Minutes Intravenous 3 times per day 08/17/12 1708        Assessment/Plan: s/p Procedure(s): LAPAROSCOPIC CHOLECYSTECTOMY (N/A) Acute cholecystitis. At this time risk benefits alternatives of proceeding to the operating room have been again discussed with patient. Patient's questions and concerns were addressed and we'll plan to proceed to the operating room as consented. Additionally was noted patient had some anemia on her morning labs. I do feel this is likely delusional. I feel her anemia is likely chronic in nature reflecting her emergency room values. There is no evidence of acute blood loss anemia at this time.  LOS: 1 day    Quinn Bartling C 08/18/2012

## 2012-08-19 MED ORDER — HYDROCODONE-ACETAMINOPHEN 5-325 MG PO TABS: 1.0000 | ORAL_TABLET | ORAL | Status: DC | PRN

## 2012-08-19 NOTE — Plan of Care (Signed)
Problem: Phase I Progression Outcomes Goal: Sutures/staples intact Outcome: Not Applicable Date Met:  08/19/12 Steri strips  Problem: Discharge Progression Outcomes Goal: Staples/sutures removed Outcome: Not Met (add Reason) Steri strips to incision sites x4 are intact clean and dry

## 2012-08-19 NOTE — Anesthesia Postprocedure Evaluation (Signed)
Anesthesia Post Note  Patient: Claire Bradley  Procedure(s) Performed: Procedure(s) (LRB): LAPAROSCOPIC CHOLECYSTECTOMY (N/A)  Anesthesia type: General  Patient location: 216  Post pain: Pain level controlled  Post assessment: Post-op Vital signs reviewed, Patient's Cardiovascular Status Stable, Respiratory Function Stable, Patent Airway, No signs of Nausea or vomiting and Pain level controlled  Last Vitals:  Filed Vitals:   08/19/12 0658  BP: 110/66  Pulse: 82  Temp: 36.9 C  Resp: 18    Post vital signs: Reviewed and stable  Level of consciousness: awake and alert   Complications: No apparent anesthesia complications

## 2012-08-19 NOTE — Discharge Summary (Signed)
Physician Discharge Summary  Patient ID: Claire Bradley MRN: [id number] DOB/AGE: 08-19-1970 42 y.o.  Admit date: 08/17/2012 Discharge date: 08/19/2012  Admission Diagnoses: Acute cholecystitis  Discharge Diagnoses: The same Active Problems:   * No active hospital problems. *   Discharged Condition: stable  Hospital Course: Patient presented to [facility] with right upper quadrant abdominal pain. Workup and evaluation was consistent for acute cholecystitis. Patient was admitted for continued management and intervention. She's taken to the operating room and underwent a laparoscopic cholecystectomy. She tolerated this well. Spent a brief period in the PACU and returned back to a surgical floor. She was advanced on her diet. Today she is tolerating regular diet. Her pain is controlled oral pain medication. She is able toward. Plans were discussed for discharge.  Consults: None  Significant Diagnostic Studies: radiology: CT scan: Abdomen and pelvis  Treatments: surgery: Laparoscopic cholecystectomy  Discharge Exam: Blood pressure 110/66, pulse 82, temperature 98.5 F (36.9 C), temperature source Oral, resp. rate 18, height 5\' 9"  (1.753 m), weight 86.183 kg (190 lb), last menstrual period 07/28/2012, SpO2 97.00%. General appearance: alert and no distress Resp: clear to auscultation bilaterally Cardio: regular rate and rhythm GI: Positive bowel sounds, soft, flat, expected tenderness. Incisions are clean dry and intact.  Disposition: 01-Home or Self Care  Discharge Orders   Future Orders Complete By Expires     Call MD for:  persistant nausea and vomiting  As directed     Call MD for:  redness, tenderness, or signs of infection (pain, swelling, redness, odor or green/yellow discharge around incision site)  As directed     Call MD for:  severe uncontrolled pain  As directed     Call MD for:  temperature >100.4  As directed     Diet - low sodium heart healthy  As directed      Discharge instructions  As directed     Comments:      Increase activity as tolerated. May place ice pack for comfort.  Alternate an anti-inflammatory such as ibuprofen (Motrin, Advil) 400-600mg  every 6 hours with the prescribed pain medication.   Do not take any additional acetaminophen as there is Tylenol in the pain medication.    Discharge wound care:  As directed     Comments:      Clean surgical sites with soap and water.  May shower the morning after surgery unless instructed by Dr. Leticia Penna otherwise.  No soaking for 2-3 weeks.    If adhesive strips are in place, they may be removed in 1-2 weeks while in the shower.    Driving Restrictions  As directed     Comments:      No driving while on pain medications.    Increase activity slowly  As directed     Lifting restrictions  As directed     Comments:      No lifting over 20lbs for 4-5 weeks post-op.        Medication List    TAKE these medications       HYDROcodone-acetaminophen 5-325 MG per tablet  Commonly known as:  NORCO/VICODIN  Take 1-2 tablets by mouth every 4 (four) hours as needed.     ibuprofen 800 MG tablet  Commonly known as:  ADVIL,MOTRIN  Take 800 mg by mouth every 6 (six) hours as needed for pain.           Follow-up Information   Follow up with Fabio Bering, MD On 09/07/2012.  Contact information:   [address] [place] [address] [phone]       Signed: Fabio Bering 08/19/2012, 1:08 PM

## 2012-08-19 NOTE — Plan of Care (Signed)
Problem: Discharge Progression Outcomes Goal: Steri-Strips applied Outcome: Completed/Met Date Met:  08/19/12 In place Goal: Other Discharge Outcomes/Goals Outcome: Completed/Met Date Met:  08/19/12 Discharged to home with family

## 2012-08-21 ENCOUNTER — Encounter (HOSPITAL_COMMUNITY): Payer: Self-pay | Admitting: General Surgery

## 2012-08-22 LAB — TYPE AND SCREEN: ABO/RH(D): A NEG

## 2013-02-21 ENCOUNTER — Emergency Department (HOSPITAL_COMMUNITY): Payer: PRIVATE HEALTH INSURANCE

## 2013-02-21 ENCOUNTER — Encounter (HOSPITAL_COMMUNITY): Payer: Self-pay | Admitting: Emergency Medicine

## 2013-02-21 ENCOUNTER — Emergency Department (HOSPITAL_COMMUNITY)
Admission: EM | Admit: 2013-02-21 | Discharge: 2013-02-21 | Disposition: A | Discharge status: Home / Self Care | Payer: PRIVATE HEALTH INSURANCE | Attending: Emergency Medicine | Admitting: Emergency Medicine

## 2013-02-21 DIAGNOSIS — Z87442 Personal history of urinary calculi: Secondary | ICD-10-CM | POA: Insufficient documentation

## 2013-02-21 DIAGNOSIS — J209 Acute bronchitis, unspecified: Secondary | ICD-10-CM | POA: Insufficient documentation

## 2013-02-21 DIAGNOSIS — D509 Iron deficiency anemia, unspecified: Secondary | ICD-10-CM

## 2013-02-21 DIAGNOSIS — J40 Bronchitis, not specified as acute or chronic: Secondary | ICD-10-CM

## 2013-02-21 DIAGNOSIS — Z880 Allergy status to penicillin: Secondary | ICD-10-CM | POA: Insufficient documentation

## 2013-02-21 DIAGNOSIS — R110 Nausea: Secondary | ICD-10-CM | POA: Insufficient documentation

## 2013-02-21 DIAGNOSIS — N920 Excessive and frequent menstruation with regular cycle: Secondary | ICD-10-CM

## 2013-02-21 DIAGNOSIS — Z720 Tobacco use: Secondary | ICD-10-CM

## 2013-02-21 DIAGNOSIS — Z8719 Personal history of other diseases of the digestive system: Secondary | ICD-10-CM | POA: Insufficient documentation

## 2013-02-21 DIAGNOSIS — F17200 Nicotine dependence, unspecified, uncomplicated: Secondary | ICD-10-CM | POA: Insufficient documentation

## 2013-02-21 DIAGNOSIS — Z79899 Other long term (current) drug therapy: Secondary | ICD-10-CM | POA: Insufficient documentation

## 2013-02-21 LAB — TROPONIN I: Troponin I: <0.3 ng/mL (ref <0.30)

## 2013-02-21 LAB — BASIC METABOLIC PANEL
BUN: 7 mg/dL (ref 6–23)
CO2: 27 mEq/L (ref 19–32)
Chloride: 104 mEq/L (ref 96–112)
Potassium: 3.6 mEq/L (ref 3.5–5.1)
Sodium: 139 mEq/L (ref 135–145)

## 2013-02-21 LAB — CBC WITH DIFFERENTIAL/PLATELET
Basophils Absolute: 0.1 10*3/uL (ref 0.0–0.1)
Eosinophils Relative: 2 % (ref 0–5)
HCT: 24.8 % — ABNORMAL LOW (ref 36.0–46.0)
Lymphocytes Relative: 36 % (ref 12–46)
Lymphs Abs: 3.2 10*3/uL (ref 0.7–4.0)
Monocytes Absolute: 0.7 10*3/uL (ref 0.1–1.0)
Monocytes Relative: 8 % (ref 3–12)
Neutro Abs: 4.8 10*3/uL (ref 1.7–7.7)
Platelets: 468 10*3/uL — ABNORMAL HIGH (ref 150–400)
RBC: 3.24 MIL/uL — ABNORMAL LOW (ref 3.87–5.11)
RDW: 18.2 % — ABNORMAL HIGH (ref 11.5–15.5)
WBC: 8.9 10*3/uL (ref 4.0–10.5)

## 2013-02-21 MED ORDER — AZITHROMYCIN 250 MG PO TABS: ORAL_TABLET | ORAL | Status: DC

## 2013-02-21 NOTE — ED Notes (Signed)
Complain of chest pain off and on for three days. 325 mg asa prior to arrival

## 2013-02-21 NOTE — ED Notes (Signed)
Pt had gallbladder surgery 6 months ago, pt here today complaining of chest pain to left side x 3 days. Pt has large knot under breast, tender to touch, has been there several month, pt has not had insurance so did not have it checked out. Pt has insurance now.

## 2013-02-21 NOTE — ED Notes (Signed)
Patient given discharge instruction, verbalized understand. IV removed, band aid applied. Patient ambulatory out of the department.  

## 2013-02-21 NOTE — ED Provider Notes (Signed)
CSN: [id number]     Arrival date & time 02/21/13  1503 History  This chart was scribed for Flint Melter, MD by Danella Maiers, ED Scribe. This patient was seen in room APA15/APA15 and the patient's care was started at 3:12 PM.   Chief Complaint  Patient presents with  . Chest Pain   The history is provided by the patient. No language interpreter was used.   HPI Comments: Claire Bradley is a 42 y.o. female who presents to the Emergency Department complaining of intermittent left-sided CP for the past 3 days. She also reports dry cough that started today. She took 325 mg Asa PTA with improvement. She was sent here from Urgent Care because their computers were down. She is anemic and supposed to take iron supplements but admits she has not been compliant until she started feeling bad three days ago. She has not had her blood tested recently. She reports lack of appetite recently due to nausea but denies emesis. She has lost 30-40 pounds. 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 She is a smoker. She had bronchitis as a child. She is not on an inhaler. She is not on any medications currently. She denies h/o this pain. She denies h/o heart problems, DM, HTN.  Past Medical History  Diagnosis Date  . Cholelithiasis   . Kidney stones    Past Surgical History  Procedure Laterality Date  . Cesarean section    . Shoulder surgery    . Tubal ligation    . Cholecystectomy N/A 08/18/2012    Procedure: LAPAROSCOPIC CHOLECYSTECTOMY;  Surgeon: Fabio Bering, MD;  Location: [facility];  Service: General;  Laterality: N/A;   No family history on file. History  Substance Use Topics  . Smoking status: Current Every Day Smoker -- 0.25 packs/day    Types: Cigarettes  . Smokeless tobacco: Never Used  . Alcohol Use: No   OB History   Grav Para Term Preterm Abortions TAB SAB Ect Mult Living                 Review of Systems  Constitutional: Positive for appetite change.  Respiratory: Positive for cough.    Cardiovascular: Positive for chest pain.  Gastrointestinal: Positive for nausea. Negative for vomiting.  All other systems reviewed and are negative.    Allergies  Penicillins  Home Medications   Current Outpatient Rx  Name  Route  Sig  Dispense  Refill  . ferrous sulfate 325 (65 FE) MG tablet   Oral   Take 325 mg by mouth 2 (two) times daily.         Marland Kitchen azithromycin (ZITHROMAX Z-PAK) 250 MG tablet      2 po day one, then 1 daily x 4 days   5 tablet   0    BP 116/64  Pulse 87  Temp(Src) 98.4 F (36.9 C) (Oral)  Resp 18  Ht 5\' 9"  (1.753 m)  Wt 190 lb (86.183 kg)  BMI 28.05 kg/m2  SpO2 97%  LMP 02/07/2013 Physical Exam  Nursing note and vitals reviewed. Constitutional: She is oriented to person, place, and time. She appears well-developed and well-nourished.  HENT:  Head: Normocephalic and atraumatic.  Right Ear: External ear normal.  Left Ear: External ear normal.  Eyes: Conjunctivae and EOM are normal. Pupils are equal, round, and reactive to light.  Neck: Normal range of motion and phonation normal. Neck supple.  Cardiovascular: Normal rate, regular rhythm, normal heart sounds and intact distal  pulses.   Pulmonary/Chest: Effort normal and breath sounds normal. She exhibits no tenderness and no bony tenderness.  Left anterior lower chest wall tenderness with questionable deformity. no crepitation or instability of the chest wall.  Abdominal: Soft. Normal appearance. She exhibits no distension. There is no tenderness. There is no guarding.  Musculoskeletal: Normal range of motion.  Neurological: She is alert and oriented to person, place, and time. No cranial nerve deficit or sensory deficit. She exhibits normal muscle tone. Coordination normal.  Skin: Skin is warm, dry and intact.  Psychiatric: She has a normal mood and affect. Her behavior is normal. Judgment and thought content normal.    ED Course  Procedures (including critical care time) Medications - No  data to display  DIAGNOSTIC STUDIES: Oxygen Saturation is 97% on RA, normal by my interpretation.    COORDINATION OF CARE: 4:04 PM- Discussed treatment plan with pt which includes CXR, EKG, blood work. Pt agrees to plan.  7:56 PM- Let pt know her tests revealed iron def. anemia, but no PNE and she will be discharged home. Pt agrees to plan.   Labs Review Labs Reviewed  CBC WITH DIFFERENTIAL - Abnormal; Notable for the following:    RBC 3.24 (*)    Hemoglobin 7.4 (*)    HCT 24.8 (*)    MCV 76.5 (*)    MCH 22.8 (*)    MCHC 29.8 (*)    RDW 18.2 (*)    Platelets 468 (*)    All other components within normal limits  BASIC METABOLIC PANEL  TROPONIN I   Imaging Review Dg Chest 2 View  02/21/2013   CLINICAL DATA:  Smoker with left-sided chest pain  EXAM: CHEST  2 VIEW  COMPARISON:  08/17/2012  FINDINGS: Lungs are hyperexpanded. No edema or focal airspace consolidation. No pneumothorax. No evidence for pleural effusion apparent The cardiopericardial silhouette is within normal limits for size. Imaged bony structures of the thorax are intact. Telemetry leads overlie the chest.  IMPRESSION: Stable.  No new or acute cardiopulmonary findings.   Electronically Signed   By: Kennith Center M.D.   On: 02/21/2013 18:37    EKG Interpretation    Date/Time:  Wednesday February 21 2013 15:11:10 EST Ventricular Rate:  77 PR Interval:  156 QRS Duration: 90 QT Interval:  400 QTC Calculation: 452 R Axis:   57 Text Interpretation:  Normal sinus rhythm with sinus arrhythmia Normal ECG No previous ECGs available Confirmed by Saliyah Gillin  MD, Jeremia Groot ([id number]) on 02/21/2013 8:13:08 PM            MDM   1. Bronchitis   2. Tobacco abuse   3. Iron deficiency anemia   4. Menorrhagia      Chest pain, likely secondary to bronchitis. No evidence for PE, pneumonia, or ACS. Incidental anemia, known previously to the patient, and likely associated with menorrhagia. She is hemodynamically stable, in stable  for discharge.  Nursing Notes Reviewed/ Care Coordinated, and agree without changes. Applicable Imaging Reviewed.  Interpretation of Laboratory Data incorporated into ED treatment   Plan: Home Medications- Zithromax; Home Treatments and Observation- stop smoking; return here if the recommended treatment, does not improve the symptoms; Recommended follow up- PCP, one week       I personally performed the services described in this documentation, which was scribed in my presence. The recorded information has been reviewed and is accurate.      Flint Melter, MD 02/21/13 2147

## 2013-05-06 HISTORY — PX: ABDOMINAL HYSTERECTOMY: SHX81

## 2013-09-01 ENCOUNTER — Emergency Department (HOSPITAL_COMMUNITY)
Admission: EM | Admit: 2013-09-01 | Discharge: 2013-09-02 | Disposition: A | Discharge status: Home / Self Care | Payer: PRIVATE HEALTH INSURANCE | Attending: Emergency Medicine | Admitting: Emergency Medicine

## 2013-09-01 ENCOUNTER — Encounter (HOSPITAL_COMMUNITY): Payer: Self-pay | Admitting: Emergency Medicine

## 2013-09-01 DIAGNOSIS — Z87442 Personal history of urinary calculi: Secondary | ICD-10-CM | POA: Insufficient documentation

## 2013-09-01 DIAGNOSIS — I10 Essential (primary) hypertension: Secondary | ICD-10-CM | POA: Insufficient documentation

## 2013-09-01 DIAGNOSIS — Z8719 Personal history of other diseases of the digestive system: Secondary | ICD-10-CM | POA: Insufficient documentation

## 2013-09-01 DIAGNOSIS — Z880 Allergy status to penicillin: Secondary | ICD-10-CM | POA: Insufficient documentation

## 2013-09-01 DIAGNOSIS — Z792 Long term (current) use of antibiotics: Secondary | ICD-10-CM | POA: Insufficient documentation

## 2013-09-01 DIAGNOSIS — M542 Cervicalgia: Secondary | ICD-10-CM | POA: Insufficient documentation

## 2013-09-01 DIAGNOSIS — F17200 Nicotine dependence, unspecified, uncomplicated: Secondary | ICD-10-CM | POA: Insufficient documentation

## 2013-09-01 NOTE — ED Provider Notes (Signed)
CSN: [id number]     Arrival date & time 09/01/13  2229 History  This chart was scribed for Claire Skeens, MD by Ardelia Mems, ED Scribe. This patient was seen in room APA04/APA04 and the patient's care was started at 11:40 PM.   Chief Complaint  Patient presents with  . Hypertension  . Neck Pain    The history is provided by the patient. No language interpreter was used.    HPI Comments: Claire Bradley is a 43 y.o. female who presents to the Emergency Department complaining of HTN tonight. She states that she has been very stressed lately which she believes is related. She states that her blood pressure was 180/102 tonight. Her blood pressure in the ED was 131/80. She denies any history of cardiac disease, pr DVT/PE. She states that she does not have a history of HTN to her knowledge and that she does not take any medications for HTN.  She states that she has also been having intermittent, moderate posterior neck pain over the past 2 hours. She denies any injury to have onset this pain. She denies any associated weakness or numbness. She denies headache, vision changes or any other symptoms.  Past Medical History  Diagnosis Date  . Cholelithiasis   . Kidney stones    Past Surgical History  Procedure Laterality Date  . Cesarean section    . Shoulder surgery    . Tubal ligation    . Cholecystectomy N/A 08/18/2012    Procedure: LAPAROSCOPIC CHOLECYSTECTOMY;  Surgeon: Fabio Bering, MD;  Location: [facility];  Service: General;  Laterality: N/A;  . Abdominal hysterectomy     History reviewed. No pertinent family history. History  Substance Use Topics  . Smoking status: Current Every Day Smoker -- 0.25 packs/day    Types: Cigarettes  . Smokeless tobacco: Never Used  . Alcohol Use: No   OB History   Grav Para Term Preterm Abortions TAB SAB Ect Mult Living                 Review of Systems  Constitutional: Negative for fever.  Eyes: Negative for visual disturbance.   Musculoskeletal: Positive for neck pain. Negative for neck stiffness.  Neurological: Negative for weakness, numbness and headaches.  All other systems reviewed and are negative.   Allergies  Penicillins  Home Medications   Prior to Admission medications   Medication Sig Start Date End Date Taking? Authorizing Provider  azithromycin (ZITHROMAX Z-PAK) 250 MG tablet 2 po day one, then 1 daily x 4 days 02/21/13   Flint Melter, MD  ferrous sulfate 325 (65 FE) MG tablet Take 325 mg by mouth 2 (two) times daily.    Historical Provider, MD   Triage Vitals: BP 116/90  Pulse 82  Temp(Src) 98.1 F (36.7 C) (Oral)  Resp 18  Ht 5\' 9"  (1.753 m)  Wt 200 lb (90.719 kg)  BMI 29.52 kg/m2  SpO2 100%  LMP 02/07/2013  Physical Exam  Nursing note and vitals reviewed. Constitutional: She is oriented to person, place, and time. She appears well-developed and well-nourished. No distress.  HENT:  Head: Normocephalic and atraumatic.  Eyes: EOM are normal. Pupils are equal, round, and reactive to light.  No papilledema.   Neck: Normal range of motion. Neck supple. No tracheal deviation present.  Tenderness at the base of the skull. No meningismus.  Cardiovascular: Normal rate and regular rhythm.   No murmur heard. Pulmonary/Chest: Effort normal. No respiratory distress.  Musculoskeletal: Normal  range of motion. She exhibits tenderness.  Mild swelling and tenderness to lower anterior tibia.  Neurological: She is alert and oriented to person, place, and time.  Visual fields intact. Equal strength. Good sensation to palpation in all extremities.  Skin: Skin is warm and dry.  Psychiatric: She has a normal mood and affect. Her behavior is normal.    ED Course  Procedures (including critical care time)  DIAGNOSTIC STUDIES: Oxygen Saturation is 100% on RA, normal by my interpretation.    COORDINATION OF CARE: 11:44 PM- Pt advised of plan for treatment and pt agrees.  Labs Review Labs  Reviewed - No data to display  Imaging Review No results found.   EKG Interpretation None      MDM   Final diagnoses:  Neck pain  High blood pressure   acute stress reaction I personally performed the services described in this documentation, which was scribed in my presence. The recorded information has been reviewed and is accurate.  Patient with transient high blood pressure prior to arrival with stressful episode. Patient denies symptoms of endorgan damage and no signs or findings of endorgan damage on exam. Patient well-appearing laughing ER. I do not feel this is an acute neurologic event or vascular event. Patient has normal neurologic and cardiac exam in ER. Discussed close followup for repeat blood pressure and supportive care for neck tension from stress. Reasons to return given. Blood pressure normal in ER. Patient had ibuprofen prior to arrival Results and differential diagnosis were discussed with the patient/parent/guardian. Close follow up outpatient was discussed, comfortable with the plan.   Filed Vitals:   09/01/13 2244 09/01/13 2320 09/01/13 2321  BP: 131/80 116/90   Pulse: 85  82  Temp: 98.1 F (36.7 C)    TempSrc: Oral    Resp: 18    Height: 5\' 9"  (1.753 m)    Weight: 200 lb (90.719 kg)    SpO2: 99%  100%      High blood pressure transient, neck pain, acute stress reaction  Claire SkeensJoshua M Stepahnie Campo, MD 09/02/13 [phone]

## 2013-09-01 NOTE — ED Notes (Signed)
Feels better, wants to go home now

## 2013-09-01 NOTE — ED Notes (Signed)
Patient reports blood pressure was 208/102 at home. Complaining of neck pain, no known injury. Patient reports, "I've had a very stressful day."

## 2013-09-02 MED ORDER — IBUPROFEN 800 MG PO TABS: 800.0000 mg | ORAL_TABLET | Freq: Once | ORAL | Status: DC

## 2013-09-02 NOTE — Discharge Instructions (Signed)
Have blood pressure rechecked next week when not stressed or in pain. Return for stroke like symptoms, chest pain, shortness of breath, new headache or other concerns. Use heat and ibuprofen for neck tightness.  If you were given medicines take as directed.  If you are on coumadin or contraceptives realize their levels and effectiveness is altered by many different medicines.  If you have any reaction (rash, tongues swelling, other) to the medicines stop taking and see a physician.   Please follow up as directed and return to the ER or see a physician for new or worsening symptoms.  Thank you. Filed Vitals:   09/01/13 2244 09/01/13 2320 09/01/13 2321  BP: 131/80 116/90   Pulse: 85  82  Temp: 98.1 F (36.7 C)    TempSrc: Oral    Resp: 18    Height: 5\' 9"  (1.753 m)    Weight: 200 lb (90.719 kg)    SpO2: 99%  100%

## 2014-03-22 ENCOUNTER — Emergency Department (HOSPITAL_COMMUNITY): Payer: Self-pay

## 2014-03-22 ENCOUNTER — Encounter (HOSPITAL_COMMUNITY)
Admission: EM | Disposition: A | Discharge status: Home / Self Care | Payer: Self-pay | Source: Home / Self Care | Attending: Family Medicine

## 2014-03-22 ENCOUNTER — Inpatient Hospital Stay (HOSPITAL_COMMUNITY): Payer: Self-pay

## 2014-03-22 ENCOUNTER — Encounter (HOSPITAL_COMMUNITY): Payer: Self-pay | Admitting: *Deleted

## 2014-03-22 ENCOUNTER — Inpatient Hospital Stay (HOSPITAL_COMMUNITY)
Admission: EM | Admit: 2014-03-22 | Discharge: 2014-03-25 | DRG: 872 | Disposition: A | Discharge status: Home / Self Care | Payer: Self-pay | Attending: Internal Medicine | Admitting: Internal Medicine

## 2014-03-22 ENCOUNTER — Inpatient Hospital Stay (HOSPITAL_COMMUNITY): Payer: Self-pay | Admitting: Anesthesiology

## 2014-03-22 DIAGNOSIS — N368 Other specified disorders of urethra: Secondary | ICD-10-CM | POA: Diagnosis present

## 2014-03-22 DIAGNOSIS — R519 Headache, unspecified: Secondary | ICD-10-CM

## 2014-03-22 DIAGNOSIS — N200 Calculus of kidney: Secondary | ICD-10-CM

## 2014-03-22 DIAGNOSIS — N201 Calculus of ureter: Secondary | ICD-10-CM

## 2014-03-22 DIAGNOSIS — Z9049 Acquired absence of other specified parts of digestive tract: Secondary | ICD-10-CM | POA: Diagnosis present

## 2014-03-22 DIAGNOSIS — D72829 Elevated white blood cell count, unspecified: Secondary | ICD-10-CM | POA: Diagnosis present

## 2014-03-22 DIAGNOSIS — R51 Headache: Secondary | ICD-10-CM

## 2014-03-22 DIAGNOSIS — R000 Tachycardia, unspecified: Secondary | ICD-10-CM | POA: Diagnosis present

## 2014-03-22 DIAGNOSIS — A419 Sepsis, unspecified organism: Principal | ICD-10-CM | POA: Diagnosis present

## 2014-03-22 DIAGNOSIS — N132 Hydronephrosis with renal and ureteral calculous obstruction: Secondary | ICD-10-CM | POA: Diagnosis present

## 2014-03-22 DIAGNOSIS — Z87442 Personal history of urinary calculi: Secondary | ICD-10-CM

## 2014-03-22 DIAGNOSIS — N211 Calculus in urethra: Secondary | ICD-10-CM | POA: Diagnosis present

## 2014-03-22 DIAGNOSIS — N12 Tubulo-interstitial nephritis, not specified as acute or chronic: Secondary | ICD-10-CM | POA: Diagnosis present

## 2014-03-22 DIAGNOSIS — Z808 Family history of malignant neoplasm of other organs or systems: Secondary | ICD-10-CM

## 2014-03-22 DIAGNOSIS — N8110 Cystocele, unspecified: Secondary | ICD-10-CM | POA: Diagnosis present

## 2014-03-22 DIAGNOSIS — Z8249 Family history of ischemic heart disease and other diseases of the circulatory system: Secondary | ICD-10-CM

## 2014-03-22 DIAGNOSIS — E876 Hypokalemia: Secondary | ICD-10-CM | POA: Diagnosis present

## 2014-03-22 DIAGNOSIS — F17210 Nicotine dependence, cigarettes, uncomplicated: Secondary | ICD-10-CM | POA: Diagnosis present

## 2014-03-22 DIAGNOSIS — R109 Unspecified abdominal pain: Secondary | ICD-10-CM | POA: Insufficient documentation

## 2014-03-22 HISTORY — PX: CYSTOSCOPY WITH STENT PLACEMENT: SHX5790

## 2014-03-22 LAB — URINE MICROSCOPIC-ADD ON

## 2014-03-22 LAB — CBC WITH DIFFERENTIAL/PLATELET
BASOS PCT: 0 % (ref 0–1)
Basophils Absolute: 0 10*3/uL (ref 0.0–0.1)
EOS PCT: 0 % (ref 0–5)
Eosinophils Absolute: 0 10*3/uL (ref 0.0–0.7)
HEMATOCRIT: 36.5 % (ref 36.0–46.0)
HEMOGLOBIN: 12.2 g/dL (ref 12.0–15.0)
Lymphocytes Relative: 4 % — ABNORMAL LOW (ref 12–46)
Lymphs Abs: 0.6 10*3/uL — ABNORMAL LOW (ref 0.7–4.0)
MCH: 32 pg (ref 26.0–34.0)
MCHC: 33.4 g/dL (ref 30.0–36.0)
MCV: 95.8 fL (ref 78.0–100.0)
MONO ABS: 0.3 10*3/uL (ref 0.1–1.0)
MONOS PCT: 2 % — AB (ref 3–12)
NEUTROS ABS: 14.3 10*3/uL — AB (ref 1.7–7.7)
Neutrophils Relative %: 94 % — ABNORMAL HIGH (ref 43–77)
Platelets: 288 10*3/uL (ref 150–400)
RBC: 3.81 MIL/uL — ABNORMAL LOW (ref 3.87–5.11)
RDW: 13 % (ref 11.5–15.5)
WBC: 15.2 10*3/uL — ABNORMAL HIGH (ref 4.0–10.5)

## 2014-03-22 LAB — URINALYSIS, ROUTINE W REFLEX MICROSCOPIC
Bilirubin Urine: NEGATIVE
Glucose, UA: NEGATIVE mg/dL
KETONES UR: 15 mg/dL — AB
Nitrite: NEGATIVE
Protein, ur: NEGATIVE mg/dL
Specific Gravity, Urine: 1.015 (ref 1.005–1.030)
UROBILINOGEN UA: 0.2 mg/dL (ref 0.0–1.0)
pH: 6 (ref 5.0–8.0)

## 2014-03-22 LAB — COMPREHENSIVE METABOLIC PANEL
ALBUMIN: 3.8 g/dL (ref 3.5–5.2)
ALT: 5 U/L (ref 0–35)
ANION GAP: 13 (ref 5–15)
AST: 15 U/L (ref 0–37)
Alkaline Phosphatase: 57 U/L (ref 39–117)
BUN: 9 mg/dL (ref 6–23)
CALCIUM: 9.4 mg/dL (ref 8.4–10.5)
CHLORIDE: 104 meq/L (ref 96–112)
CO2: 24 meq/L (ref 19–32)
CREATININE: 1.06 mg/dL (ref 0.50–1.10)
GFR calc Af Amer: 75 mL/min — ABNORMAL LOW (ref >90)
GFR, EST NON AFRICAN AMERICAN: 64 mL/min — AB (ref >90)
Glucose, Bld: 102 mg/dL — ABNORMAL HIGH (ref 70–99)
Potassium: 3.3 mEq/L — ABNORMAL LOW (ref 3.7–5.3)
Sodium: 141 mEq/L (ref 137–147)
Total Bilirubin: 0.5 mg/dL (ref 0.3–1.2)
Total Protein: 6.9 g/dL (ref 6.0–8.3)

## 2014-03-22 LAB — PREGNANCY, URINE: PREG TEST UR: NEGATIVE

## 2014-03-22 LAB — LIPASE, BLOOD: LIPASE: 17 U/L (ref 11–59)

## 2014-03-22 SURGERY — CYSTOSCOPY, WITH STENT INSERTION
Anesthesia: General | Site: Ureter | Laterality: Left

## 2014-03-22 MED ORDER — HYDROMORPHONE HCL 1 MG/ML IJ SOLN
1.0000 mg | INTRAMUSCULAR | Status: DC | PRN
  Administered 2014-03-22: 1 mg via INTRAVENOUS
  Filled 2014-03-22: qty 1

## 2014-03-22 MED ORDER — ONDANSETRON HCL 4 MG/2ML IJ SOLN
4.0000 mg | Freq: Three times a day (TID) | INTRAMUSCULAR | Status: DC | PRN
  Administered 2014-03-22: 4 mg via INTRAVENOUS

## 2014-03-22 MED ORDER — SODIUM CHLORIDE 0.9 % IV BOLUS (SEPSIS)
1000.0000 mL | Freq: Once | INTRAVENOUS | Status: AC
  Administered 2014-03-22: 1000 mL via INTRAVENOUS

## 2014-03-22 MED ORDER — ACETAMINOPHEN 650 MG RE SUPP: 650.0000 mg | Freq: Four times a day (QID) | RECTAL | Status: DC | PRN

## 2014-03-22 MED ORDER — ONDANSETRON HCL 4 MG/2ML IJ SOLN
4.0000 mg | Freq: Four times a day (QID) | INTRAMUSCULAR | Status: DC | PRN
  Administered 2014-03-23 – 2014-03-25 (×5): 4 mg via INTRAVENOUS
  Filled 2014-03-22 (×5): qty 2

## 2014-03-22 MED ORDER — LIDOCAINE VISCOUS 2 % MT SOLN
OROMUCOSAL | Status: AC
  Filled 2014-03-22: qty 15

## 2014-03-22 MED ORDER — MIDAZOLAM HCL 2 MG/2ML IJ SOLN
INTRAMUSCULAR | Status: AC
  Filled 2014-03-22: qty 2

## 2014-03-22 MED ORDER — ACETAMINOPHEN 325 MG PO TABS
650.0000 mg | ORAL_TABLET | Freq: Four times a day (QID) | ORAL | Status: DC | PRN
  Administered 2014-03-23 – 2014-03-24 (×4): 650 mg via ORAL
  Filled 2014-03-22 (×4): qty 2

## 2014-03-22 MED ORDER — CIPROFLOXACIN HCL 500 MG PO TABS: 500.0000 mg | ORAL_TABLET | Freq: Two times a day (BID) | ORAL | Status: DC

## 2014-03-22 MED ORDER — CIPROFLOXACIN IN D5W 400 MG/200ML IV SOLN
400.0000 mg | INTRAVENOUS | Status: AC
  Administered 2014-03-22: 400 mg via INTRAVENOUS
  Filled 2014-03-22: qty 200

## 2014-03-22 MED ORDER — ONDANSETRON HCL 4 MG PO TABS
4.0000 mg | ORAL_TABLET | Freq: Four times a day (QID) | ORAL | Status: DC | PRN
  Administered 2014-03-24: 4 mg via ORAL
  Filled 2014-03-22: qty 1

## 2014-03-22 MED ORDER — ONDANSETRON HCL 4 MG/2ML IJ SOLN
INTRAMUSCULAR | Status: AC
  Filled 2014-03-22: qty 2

## 2014-03-22 MED ORDER — PROPOFOL INFUSION 10 MG/ML OPTIME
INTRAVENOUS | Status: DC | PRN
  Administered 2014-03-22: 75 ug/kg/min via INTRAVENOUS

## 2014-03-22 MED ORDER — POTASSIUM CHLORIDE 10 MEQ/100ML IV SOLN: 10.0000 meq | INTRAVENOUS | Status: AC

## 2014-03-22 MED ORDER — FENTANYL CITRATE 0.05 MG/ML IJ SOLN
INTRAMUSCULAR | Status: AC
  Filled 2014-03-22: qty 2

## 2014-03-22 MED ORDER — TRAZODONE HCL 50 MG PO TABS
25.0000 mg | ORAL_TABLET | Freq: Every evening | ORAL | Status: DC | PRN
  Administered 2014-03-25: 25 mg via ORAL
  Filled 2014-03-22: qty 1

## 2014-03-22 MED ORDER — SODIUM CHLORIDE 0.9 % IV SOLN
INTRAVENOUS | Status: AC
  Administered 2014-03-22: 15:00:00 via INTRAVENOUS

## 2014-03-22 MED ORDER — FENTANYL CITRATE 0.05 MG/ML IJ SOLN
INTRAMUSCULAR | Status: DC | PRN
  Administered 2014-03-22 (×4): 25 ug via INTRAVENOUS

## 2014-03-22 MED ORDER — ONDANSETRON HCL 4 MG/2ML IJ SOLN
4.0000 mg | Freq: Once | INTRAMUSCULAR | Status: DC
  Filled 2014-03-22: qty 2

## 2014-03-22 MED ORDER — ONDANSETRON HCL 4 MG/2ML IJ SOLN: 4.0000 mg | Freq: Once | INTRAMUSCULAR | Status: DC

## 2014-03-22 MED ORDER — CEFTRIAXONE SODIUM 1 G IJ SOLR
1.0000 g | Freq: Once | INTRAMUSCULAR | Status: AC
  Administered 2014-03-22: 1 g via INTRAVENOUS
  Filled 2014-03-22: qty 10

## 2014-03-22 MED ORDER — MIDAZOLAM HCL 5 MG/5ML IJ SOLN
INTRAMUSCULAR | Status: DC | PRN
  Administered 2014-03-22: 2 mg via INTRAVENOUS
  Administered 2014-03-22 (×2): 1 mg via INTRAVENOUS

## 2014-03-22 MED ORDER — PROPOFOL 10 MG/ML IV BOLUS
INTRAVENOUS | Status: AC
  Filled 2014-03-22: qty 20

## 2014-03-22 MED ORDER — PHENAZOPYRIDINE HCL 200 MG PO TABS: 200.0000 mg | ORAL_TABLET | Freq: Three times a day (TID) | ORAL | Status: DC | PRN

## 2014-03-22 MED ORDER — ONDANSETRON HCL 4 MG/2ML IJ SOLN
4.0000 mg | Freq: Once | INTRAMUSCULAR | Status: AC
  Administered 2014-03-22: 4 mg via INTRAVENOUS

## 2014-03-22 MED ORDER — ONDANSETRON HCL 4 MG/5ML PO SOLN
ORAL | Status: AC
  Filled 2014-03-22: qty 1

## 2014-03-22 MED ORDER — MORPHINE SULFATE 4 MG/ML IJ SOLN
4.0000 mg | Freq: Once | INTRAMUSCULAR | Status: AC
  Administered 2014-03-22: 4 mg via INTRAVENOUS
  Filled 2014-03-22: qty 1

## 2014-03-22 MED ORDER — HYDROMORPHONE HCL 1 MG/ML IJ SOLN
1.0000 mg | INTRAMUSCULAR | Status: DC | PRN
  Administered 2014-03-22 – 2014-03-25 (×11): 1 mg via INTRAVENOUS
  Filled 2014-03-22 (×12): qty 1

## 2014-03-22 MED ORDER — OXYCODONE-ACETAMINOPHEN 5-325 MG PO TABS: 1.0000 | ORAL_TABLET | ORAL | Status: DC | PRN

## 2014-03-22 MED ORDER — LACTATED RINGERS IV SOLN
INTRAVENOUS | Status: DC | PRN
  Administered 2014-03-22: 16:00:00 via INTRAVENOUS

## 2014-03-22 MED ORDER — LIDOCAINE HCL 2 % EX GEL
CUTANEOUS | Status: AC
  Filled 2014-03-22: qty 10

## 2014-03-22 SURGICAL SUPPLY — 19 items
BAG HAMPER (MISCELLANEOUS) ×2 IMPLANT
CATH OPEN TIP 6FR (CATHETERS) IMPLANT
CATH URET 5FR 28IN CONE TIP (BALLOONS)
CATH URET 5FR 28IN OPEN ENDED (CATHETERS) IMPLANT
CATH URET 5FR 70CM CONE TIP (BALLOONS) IMPLANT
CLOTH BEACON ORANGE TIMEOUT ST (SAFETY) ×2 IMPLANT
GLOVE INDICATOR 7.0 STRL GRN (GLOVE) ×2 IMPLANT
GLOVE SS BIOGEL STRL SZ 6.5 (GLOVE) ×1 IMPLANT
GLOVE SUPERSENSE BIOGEL SZ 6.5 (GLOVE) ×1
GLOVE SURG SS PI 8.0 STRL IVOR (GLOVE) ×2 IMPLANT
GOWN STRL REIN XL XLG (GOWN DISPOSABLE) ×2 IMPLANT
GOWN STRL REUS W/TWL LRG LVL3 (GOWN DISPOSABLE) ×2 IMPLANT
GOWN STRL REUS W/TWL XL LVL3 (GOWN DISPOSABLE) ×2 IMPLANT
GUIDEWIRE STR DUAL SENSOR (WIRE) ×2 IMPLANT
KIT ROOM TURNOVER AP CYSTO (KITS) ×2 IMPLANT
NS IRRIG 500ML POUR BTL (IV SOLUTION) ×2 IMPLANT
PACK CYSTO (CUSTOM PROCEDURE TRAY) ×2 IMPLANT
PAD ARMBOARD 7.5X6 YLW CONV (MISCELLANEOUS) ×2 IMPLANT
STENT CONTOUR 6FRX26X.035 (STENTS) ×2 IMPLANT

## 2014-03-22 NOTE — ED Notes (Signed)
Lab called....additional urine specimen is needed for urine culture.

## 2014-03-22 NOTE — ED Notes (Signed)
Pt states symptoms began suddenly around 0330. States she has vomited multiple times since along with chills, left lower back pain, and headache.

## 2014-03-22 NOTE — Consult Note (Signed)
Subjective: I was called in consultation by Dr. Manus Gunningancour to see Claire Bradley for a 5mm left ureteral stone with obstruction and possible UTI.   She has pyuria and a WBC on 15.2 with tachycardia but no fever.    She had the onset this am of pain that has been severe.  She also reports a fever.   She has no voiding complaints or hematuria.   She has nausea with vomiting.  ROS:  Review of Systems  Constitutional: Positive for fever.  Gastrointestinal: Positive for nausea and abdominal pain.  Genitourinary: Positive for flank pain. Negative for dysuria, urgency and frequency.  All other systems reviewed and are negative.  Allergies  Allergen Reactions  . Penicillins Nausea And Vomiting    Past Medical History  Diagnosis Date  . Cholelithiasis   . Kidney stones     Past Surgical History  Procedure Laterality Date  . Cesarean section    . Shoulder surgery    . Tubal ligation    . Cholecystectomy N/A 08/18/2012    Procedure: LAPAROSCOPIC CHOLECYSTECTOMY;  Surgeon: Fabio BeringBrent C Ziegler, MD;  Location: [facility];  Service: General;  Laterality: N/A;  . Abdominal hysterectomy      History   Social History  . Marital Status: Single    Spouse Name: N/A    Number of Children: N/A  . Years of Education: N/A   Occupational History  . Not on file.   Social History Main Topics  . Smoking status: Current Every Day Smoker -- 0.25 packs/day    Types: Cigarettes  . Smokeless tobacco: Never Used  . Alcohol Use: No  . Drug Use: No  . Sexual Activity: Yes    Birth Control/ Protection: Surgical   Other Topics Concern  . Not on file   Social History Narrative    No family history on file.  Anti-infectives: Anti-infectives    Start     Dose/Rate Route Frequency Ordered Stop   03/22/14 1300  cefTRIAXone (ROCEPHIN) 1 g in dextrose 5 % 50 mL IVPB     1 g100 mL/hr over 30 Minutes Intravenous  Once 03/22/14 1255        Current Facility-Administered Medications  Medication Dose Route  Frequency Provider Last Rate Last Dose  . cefTRIAXone (ROCEPHIN) 1 g in dextrose 5 % 50 mL IVPB  1 g Intravenous Once Glynn OctaveStephen Rancour, MD 100 mL/hr at 03/22/14 1327 1 g at 03/22/14 1327  . ondansetron (ZOFRAN) 4 MG/5ML solution           . ondansetron (ZOFRAN) injection 4 mg  4 mg Intravenous Once Glynn OctaveStephen Rancour, MD   4 mg at 03/22/14 1201   Current Outpatient Prescriptions  Medication Sig Dispense Refill  . azithromycin (ZITHROMAX Z-PAK) 250 MG tablet 2 po day one, then 1 daily x 4 days (Patient not taking: Reported on 03/22/2014) 5 tablet 0     Objective: Vital signs in last 24 hours: Temp:  [98.4 F (36.9 C)] 98.4 F (36.9 C) (12/18 1110) Pulse Rate:  [91-103] 91 (12/18 1346) Resp:  [19-21] 19 (12/18 1346) BP: (103-110)/(47-63) 106/63 mmHg (12/18 1346) SpO2:  [96 %] 96 % (12/18 1346) Weight:  [81.647 kg (180 lb)] 81.647 kg (180 lb) (12/18 1110)  Intake/Output from previous day:   Intake/Output this shift:     Physical Exam  Constitutional: She is oriented to person, place, and time and well-developed, well-nourished, and in no distress.  HENT:  Head: Normocephalic and atraumatic.  Neck:  Normal range of motion. Neck supple.  Cardiovascular: Regular rhythm.   No murmur heard. tachycardic  Pulmonary/Chest: Effort normal and breath sounds normal. No respiratory distress.  Abdominal: Soft. There is tenderness (left flank and left lower quadrant).  Musculoskeletal: Normal range of motion. She exhibits no edema.  Neurological: She is alert and oriented to person, place, and time.  Skin: Skin is warm and dry. No erythema.  Psychiatric: Mood and affect normal.  Vitals reviewed.   Lab Results:   Recent Labs  03/22/14 1137  WBC 15.2*  HGB 12.2  HCT 36.5  PLT 288   BMET  Recent Labs  03/22/14 1137  NA 141  K 3.3*  CL 104  CO2 24  GLUCOSE 102*  BUN 9  CREATININE 1.06  CALCIUM 9.4   PT/INR No results for input(s): LABPROT, INR in the last 72 hours. ABG No  results for input(s): PHART, HCO3 in the last 72 hours.  Invalid input(s): PCO2, PO2  Studies/Results: Ct Head Wo Contrast  03/22/2014   CLINICAL DATA:  Headache and fever  EXAM: CT HEAD WITHOUT CONTRAST  TECHNIQUE: Contiguous axial images were obtained from the base of the skull through the vertex without intravenous contrast.  COMPARISON:  None.  FINDINGS: The ventricles are normal in size and configuration. There is no mass, hemorrhage, extra-axial fluid collection, or midline shift. Gray-white compartments are normal. No acute infarct apparent. Bony calvarium appears intact. The mastoid air cells are clear. There is debris in each external auditory canal.  IMPRESSION: Suspect cerumen in each external auditory canal. No intracranial mass, hemorrhage, or focal gray -white compartment lesions/acute appearing infarct.   Electronically Signed   By: Bretta BangWilliam  Woodruff M.D.   On: 03/22/2014 12:28   Ct Renal Stone Study  03/22/2014   CLINICAL DATA:  Left flank pain since 3 a.m. today.  Vomiting.  EXAM: CT ABDOMEN AND PELVIS WITHOUT CONTRAST  TECHNIQUE: Multidetector CT imaging of the abdomen and pelvis was performed following the standard protocol without IV contrast.  COMPARISON:  07/28/2012  FINDINGS: There is bibasilar dependent atelectasis. No effusions. Heart is normal size.  Moderate left hydronephrosis. Extensive left perinephric stranding. 5 mm proximal left ureteral stone. No additional ureteral stones. No renal stones. No hydronephrosis on the right.  Prior cholecystectomy. Liver, spleen, pancreas, adrenals have an unremarkable unenhanced appearance. Appendix is visualized and is normal. Stomach, large and small bowel are unremarkable. No free fluid, free air or adenopathy. Aorta is normal caliber.  No acute bony abnormality or focal bone lesion.  IMPRESSION: 5 mm proximal left ureteral stone with moderate hydronephrosis and extensive perinephric stranding.   Electronically Signed   By: Charlett NoseKevin  Dover  M.D.   On: 03/22/2014 13:28     Assessment: She has a 5mm obstructing left proximal stone with probable UTI and only partial relief with medication.   Plan: She will be admitted to the hospitalist service and I will take her to the OR for cystoscopy and left ureteral stenting.   She will need ureteroscopy at a later date.  I reviewed the risks of bleeding, infection, ureteral injury, need for secondary procedures, thrombotic events and anesthetic complications.   CC: Dr. Manus Gunningancour.     LOS: 0 days    Claire Bradley 03/22/2014

## 2014-03-22 NOTE — Brief Op Note (Signed)
03/22/2014  4:47 PM  PATIENT:  Claire ChickPatricia M Bradley  43 y.o. female  PRE-OPERATIVE DIAGNOSIS:  left ureteral stone  POST-OPERATIVE DIAGNOSIS:  left ureteral stone with pyonephrosis  PROCEDURE:  Procedure(s): CYSTOSCOPY WITH STENT PLACEMENT (Left)  SURGEON:  Surgeon(s) and Role:    * Anner CreteJohn J Aribella Vavra, MD - Primary  PHYSICIAN ASSISTANT:   ASSISTANTS: none   ANESTHESIA:   MAC  EBL:  Total I/O In: 100 [I.V.:100] Out: -   BLOOD ADMINISTERED:none  DRAINS: 6 x 26 left JJ stent   LOCAL MEDICATIONS USED:  LIDOCAINE  and Amount: 10 ml jelly  SPECIMEN:  No Specimen  DISPOSITION OF SPECIMEN:  N/A  COUNTS:  YES  TOURNIQUET:  * No tourniquets in log *  DICTATION: .Other Dictation: Dictation Number [id number]  PLAN OF CARE: Admit for overnight observation  PATIENT DISPOSITION:  PACU - hemodynamically stable.   Delay start of Pharmacological VTE agent (>24hrs) due to surgical blood loss or risk of bleeding: not applicable

## 2014-03-22 NOTE — Discharge Instructions (Signed)
Ureteral Stent Implantation Ureteral stent implantation is the implantation of a soft plastic tube with multiple holes into the tube that drains urine from your kidney to your bladder (ureter). The stent helps drain your kidney when there is a blockage of the flow of urine in your ureter. The stent has a coil on each end to keep it from falling out. One end stays in the kidney. The other end stays in the bladder. It is most often taken out after any blockage has been removed or your ureter has healed. Short-term stents have a string attached to make removal quite easy. Removal of a short-term stent can be done in your health care provider's office or by you at home. Long-term stents need to be changed every few months. LET [facility]OUR [facility] CARE PROVIDER KNOW ABOUT:  Any allergies you have.  All medicines you are taking, including vitamins, herbs, eye drops, creams, and over-the-counter medicines.  Previous problems you or members of your family have had with the use of anesthetics.  Any blood disorders you have.  Previous surgeries you have had.  Medical conditions you have. RISKS AND COMPLICATIONS Generally, ureteral stent implantation is a safe procedure. However, as with any procedure, complications can occur. Possible complications include:  Movement of the stent away from where it was originally placed (migration). This may affect the ability of the stent to properly drain your kidney. If migration of the stent occurs, the stent may need to be replaced or repositioned.  Perforation of the ureter.  Infection. BEFORE THE PROCEDURE  You may be asked to wash your genital area with sterile soap the morning of your procedure.  You may be given an oral antibiotic which you should take with a sip of water as prescribed by your health care provider.  You may be asked to not eat or drink for 8 hours before the surgery. PROCEDURE  First you will be given an anesthetic so you do not feel pain  during the procedure.  Your health care provider will insert a special lighted instrument called a cystoscope into your bladder. This allows your health care provider to see the opening to your ureter.  A thin wire is carefully threaded into your bladder and up the ureter. The stent is inserted over the wire and the wire is then removed.  Your bladder will be emptied of urine. AFTER THE PROCEDURE You will be taken to a recovery room until it is okay for you to go home. Document Released: 03/19/2000 Document Revised: 03/27/2013 Document Reviewed: 08/29/2012 [facility] Patient Information 2015 [employer] [place]. This information is not intended to replace advice given to you by your health care provider. Make sure you discuss any questions you have with your health care provider.  My office will call to arrange your next procedure.

## 2014-03-22 NOTE — Transfer of Care (Signed)
Immediate Anesthesia Transfer of Care Note  Patient: Claire Bradley  Procedure(s) Performed: Procedure(s): CYSTOSCOPY WITH STENT PLACEMENT (Left)  Patient Location: PACU  Anesthesia Type:MAC  Level of Consciousness: awake and patient cooperative  Airway & Oxygen Therapy: Patient Spontanous Breathing and Patient connected to face mask oxygen  Post-op Assessment: Report given to PACU RN, Post -op Vital signs reviewed and stable and Patient moving all extremities  Post vital signs: Reviewed and stable  Complications: No apparent anesthesia complications

## 2014-03-22 NOTE — ED Provider Notes (Signed)
CSN: [id number]     Arrival date & time 03/22/14  1106 History  This chart was scribed for Glynn OctaveStephen Tylen Leverich, MD by Luisa DagoPriscilla Tutu, ED Scribe. This patient was seen in room [facility] and the patient's care was started at 11:20 AM.    Chief Complaint  Patient presents with  . Emesis   The history is provided by the patient. No language interpreter was used.   HPI Comments: Claire Bradley is a 43 y.o. female with PMhx of a kidney stone presents to the Emergency Department complaining of several episodes of emesis that started at 4AM. Pt states that she has had approximately 30 episodes of emesis. She states that this morning she woke up with left sided back pain. Pt endorses associated HA with gradual onset after 6 episodes of emesis. She is also complaining of associated chills and subjective fever. Current ED temperature is 98.4. Pt states that this current episode does not feel like her prior kidney stone. Denies any chest pain, SOB, neck pain, congestion, diaphoresis, recent travels, sick contacts, or getting a flu vaccine this season.      Past Medical History  Diagnosis Date  . Cholelithiasis   . Kidney stones    Past Surgical History  Procedure Laterality Date  . Cesarean section    . Shoulder surgery Left   . Tubal ligation    . Cholecystectomy N/A 08/18/2012    Procedure: LAPAROSCOPIC CHOLECYSTECTOMY;  Surgeon: Fabio BeringBrent C Ziegler, MD;  Location: [facility];  Service: General;  Laterality: N/A;  . Abdominal hysterectomy     History reviewed. No pertinent family history. History  Substance Use Topics  . Smoking status: Current Every Day Smoker -- 0.25 packs/day for 25 years    Types: Cigarettes  . Smokeless tobacco: Never Used  . Alcohol Use: No   OB History    No data available     Review of Systems A complete 10 system review of systems was obtained and all systems are negative except as noted in the HPI and PMH.     Allergies  Penicillins  Home Medications   Prior  to Admission medications   Medication Sig Start Date End Date Taking? Authorizing Provider  azithromycin (ZITHROMAX Z-PAK) 250 MG tablet 2 po day one, then 1 daily x 4 days Patient not taking: Reported on 03/22/2014 02/21/13   Flint MelterElliott L Wentz, MD  ciprofloxacin (CIPRO) 500 MG tablet Take 1 tablet (500 mg total) by mouth 2 (two) times daily. 03/22/14   Anner CreteJohn J Wrenn, MD  oxyCODONE-acetaminophen (ROXICET) 5-325 MG per tablet Take 1 tablet by mouth every 4 (four) hours as needed for severe pain. 03/22/14   Anner CreteJohn J Wrenn, MD  phenazopyridine (PYRIDIUM) 200 MG tablet Take 1 tablet (200 mg total) by mouth 3 (three) times daily as needed for pain. 03/22/14   Anner CreteJohn J Wrenn, MD   BP 103/47 mmHg  Pulse 103  Temp(Src) 98.4 F (36.9 C) (Oral)  Resp 21  Ht 5\' 9"  (1.753 m)  Wt 180 lb (81.647 kg)  BMI 26.57 kg/m2  SpO2 96%  LMP 02/07/2013 Physical Exam  Constitutional: She is oriented to person, place, and time. She appears well-developed and well-nourished. No distress.  HENT:  Head: Normocephalic and atraumatic.  Mouth/Throat: Oropharynx is clear and moist. No oropharyngeal exudate.  Moist mucous membranes.  Eyes: Conjunctivae and EOM are normal. Pupils are equal, round, and reactive to light.  Neck: Normal range of motion. Neck supple.  No meningismus.  Cardiovascular: Normal rate,  regular rhythm, normal heart sounds and intact distal pulses.   No murmur heard. Pulmonary/Chest: Effort normal and breath sounds normal. No respiratory distress.  Abdominal: Soft. There is no tenderness. There is CVA tenderness (left). There is no rebound and no guarding.  Musculoskeletal: Normal range of motion. She exhibits no edema or tenderness.  Neurological: She is alert and oriented to person, place, and time. No cranial nerve deficit. She exhibits normal muscle tone. Coordination normal.  No ataxia on finger to nose bilaterally. No pronator drift. 5/5 strength throughout. CN 2-12 intact. Negative Romberg. Equal  grip strength. Sensation intact. Gait is normal.   Skin: Skin is warm.  Psychiatric: She has a normal mood and affect. Her behavior is normal.  Nursing note and vitals reviewed.   ED Course  Procedures (including critical care time)  DIAGNOSTIC STUDIES: Oxygen Saturation is 96% on  RA, adequate by my interpretation.    COORDINATION OF CARE: 11:26 AM- Will order a UA. Pt advised of plan for treatment and pt agrees.  Labs Review Labs Reviewed  URINALYSIS, ROUTINE W REFLEX MICROSCOPIC - Abnormal; Notable for the following:    Hgb urine dipstick MODERATE (*)    Ketones, ur 15 (*)    Leukocytes, UA SMALL (*)    All other components within normal limits  CBC WITH DIFFERENTIAL - Abnormal; Notable for the following:    WBC 15.2 (*)    RBC 3.81 (*)    Neutrophils Relative % 94 (*)    Neutro Abs 14.3 (*)    Lymphocytes Relative 4 (*)    Lymphs Abs 0.6 (*)    Monocytes Relative 2 (*)    All other components within normal limits  COMPREHENSIVE METABOLIC PANEL - Abnormal; Notable for the following:    Potassium 3.3 (*)    Glucose, Bld 102 (*)    GFR calc non Af Amer 64 (*)    GFR calc Af Amer 75 (*)    All other components within normal limits  URINE MICROSCOPIC-ADD ON - Abnormal; Notable for the following:    Squamous Epithelial / LPF MANY (*)    Bacteria, UA MANY (*)    All other components within normal limits  URINE CULTURE  PREGNANCY, URINE  LIPASE, BLOOD  BASIC METABOLIC PANEL  CBC    Imaging Review Ct Head Wo Contrast  03/22/2014   CLINICAL DATA:  Headache and fever  EXAM: CT HEAD WITHOUT CONTRAST  TECHNIQUE: Contiguous axial images were obtained from the base of the skull through the vertex without intravenous contrast.  COMPARISON:  None.  FINDINGS: The ventricles are normal in size and configuration. There is no mass, hemorrhage, extra-axial fluid collection, or midline shift. Gray-white compartments are normal. No acute infarct apparent. Bony calvarium appears intact.  The mastoid air cells are clear. There is debris in each external auditory canal.  IMPRESSION: Suspect cerumen in each external auditory canal. No intracranial mass, hemorrhage, or focal gray -white compartment lesions/acute appearing infarct.   Electronically Signed   By: Bretta BangWilliam  Woodruff M.D.   On: 03/22/2014 12:28   Ct Renal Stone Study  03/22/2014   CLINICAL DATA:  Left flank pain since 3 a.m. today.  Vomiting.  EXAM: CT ABDOMEN AND PELVIS WITHOUT CONTRAST  TECHNIQUE: Multidetector CT imaging of the abdomen and pelvis was performed following the standard protocol without IV contrast.  COMPARISON:  07/28/2012  FINDINGS: There is bibasilar dependent atelectasis. No effusions. Heart is normal size.  Moderate left hydronephrosis. Extensive left perinephric stranding. 5  mm proximal left ureteral stone. No additional ureteral stones. No renal stones. No hydronephrosis on the right.  Prior cholecystectomy. Liver, spleen, pancreas, adrenals have an unremarkable unenhanced appearance. Appendix is visualized and is normal. Stomach, large and small bowel are unremarkable. No free fluid, free air or adenopathy. Aorta is normal caliber.  No acute bony abnormality or focal bone lesion.  IMPRESSION: 5 mm proximal left ureteral stone with moderate hydronephrosis and extensive perinephric stranding.   Electronically Signed   By: Charlett Nose M.D.   On: 03/22/2014 13:28     EKG Interpretation None      MDM   Final diagnoses:  Headache  Flank pain  Pyelonephritis  Kidney stone   patient awoke this morning with flank pain, vomiting, chills and headache. Denies urinary or vaginal symptoms. Labs, UA, IVF, symptom control.  UA appears infected.  IV rocephin given, culture obtained.  WBC 15, tachycardia to 100s. No fever.  Meets sepsis criteria.  CT with 5mm proximal stone with stranding and hydronephrosis. D/w Dr. Annabell Howells who will plan on placing stent.   HR 100s, BP 100-110 systolic after IVF. Pain  controlled. Admission d/w Dr. Cena Benton.  CRITICAL CARE Performed by: Glynn Octave Total critical care time: 30 Critical care time was exclusive of separately billable procedures and treating other patients. Critical care was necessary to treat or prevent imminent or life-threatening deterioration. Critical care was time spent personally by me on the following activities: development of treatment plan with patient and/or surrogate as well as nursing, discussions with consultants, evaluation of patient's response to treatment, examination of patient, obtaining history from patient or surrogate, ordering and performing treatments and interventions, ordering and review of laboratory studies, ordering and review of radiographic studies, pulse oximetry and re-evaluation of patient's condition.    I personally performed the services described in this documentation, which was scribed in my presence. The recorded information has been reviewed and is accurate.    Glynn Octave, MD 03/22/14 [phone]

## 2014-03-22 NOTE — Anesthesia Preprocedure Evaluation (Addendum)
Anesthesia Evaluation  Patient identified by MRN, date of birth, ID band Patient awake    Reviewed: Allergy & Precautions, H&P , NPO status , Patient's Chart, lab work & pertinent test results, reviewed documented beta blocker date and time   Airway Mallampati: II  TM Distance: >3 FB Neck ROM: Full    Dental  (+) Teeth Intact   Pulmonary Current Smoker,  breath sounds clear to auscultation Pt presents with hoarse sounding phonation.States "has been like this" for long time.        Cardiovascular Exercise Tolerance: Good Rhythm:Regular Rate:Normal     Neuro/Psych    GI/Hepatic   Endo/Other    Renal/GU Renal disease     Musculoskeletal   Abdominal (+) + obese,  Abdomen: soft. Bowel sounds: normal.  Peds  Hematology   Anesthesia Other Findings   Reproductive/Obstetrics                           Anesthesia Physical Anesthesia Plan  ASA: II and emergent  Anesthesia Plan: General   Post-op Pain Management:    Induction: Intravenous  Airway Management Planned: LMA  Additional Equipment:   Intra-op Plan:   Post-operative Plan: Extubation in OR  Informed Consent:   Plan Discussed with: CRNA, Anesthesiologist and Surgeon  Anesthesia Plan Comments:         Anesthesia Quick Evaluation

## 2014-03-22 NOTE — H&P (Signed)
[facility] History and Physical  Claire Chickatricia M Glastetter ZOX:[id number] DOB: [birth year] DOA: 03/22/2014  Referring physician:  PCP: No PCP Per Patient Health department  Chief Complaint: Abdominal pain nausea and vomiting  HPI: Claire Bradley is a 43 y.o. female past medical history that includes cholelithiasis and kidney stones presents to emergency department with chief complaint of sudden onset abdominal pain nausea chills and subjective fever. Initial evaluation in the emergency department reveals ureteral stone with obstruction and hydronephrosis. A she reports sudden onset around 3:30 AM of left flank pain. Associated symptoms include nausea vomiting fever chills. Pain is constant and described as an ache radiating to the "whole left side of her body. She denies chest pain palpitation shortness of breath. She denies bloody or coffee ground emesis. She denies dysuria hematuria frequency or urgency. She rates the pain a 10 out of 10. Workup in the emergency department includes a complete metabolic panel significant for potassium of 3.3 and serum glucose of 102, complete blood count significant for WBCs of 15.2 relative neutrophils 94% absolute neutrophils 14.3, urinalysis with many bacteria 3-6 RBCs 11-20 WBCs small leukocytes negative nitrites. EKG with sinus tachycardia rate of 102 and CT anal stones study reveals 5 mm proximal left ureteral stone with moderate hydronephrosis and extensive perinephric stranding. In the emergency department she is afebrile with a slightly soft blood pressure and tachycardia at 102. She is not hypoxic.  In the emergency department she is provided with a liter of normal saline intravenously Zofran for nausea morphine and Dilaudid for pain. She is also provided with Rocephin and Cipro intravenously and is evaluated by urology who plans to take her to the OR this afternoon for stents.  Review of Systems:  10 point review of systems complete and all systems  are negative except as indicated by the history of present illness   Past Medical History  Diagnosis Date  . Cholelithiasis   . Kidney stones    Past Surgical History  Procedure Laterality Date  . Cesarean section    . Shoulder surgery    . Tubal ligation    . Cholecystectomy N/A 08/18/2012    Procedure: LAPAROSCOPIC CHOLECYSTECTOMY;  Surgeon: Fabio BeringBrent C Ziegler, MD;  Location: [facility];  Service: General;  Laterality: N/A;  . Abdominal hysterectomy     Social History:  reports that she has been smoking Cigarettes.  She has been smoking about 0.25 packs per day. She has never used smokeless tobacco. She reports that she does not drink alcohol or use illicit drugs. He is employed at [employer] she lives at home with her children she is independent with ADLs Allergies  Allergen Reactions  . Penicillins Nausea And Vomiting    No family history on file. mother is alive with a history of cervical cancer and coronary artery disease father's health is unknown she has siblings 2 have health history is positive for hypertension  Prior to Admission medications   Medication Sig Start Date End Date Taking? Authorizing Provider  azithromycin (ZITHROMAX Z-PAK) 250 MG tablet 2 po day one, then 1 daily x 4 days Patient not taking: Reported on 03/22/2014 02/21/13   Flint MelterElliott L Wentz, MD   Physical Exam: Filed Vitals:   03/22/14 1110 03/22/14 1230 03/22/14 1330 03/22/14 1346  BP: 103/47 110/57 106/63 106/63  Pulse: 103 103 93 91  Temp: 98.4 F (36.9 C)     TempSrc: Oral     Resp: 21 20 20 19   Height: 5\' 9"  (1.753 m)  Weight: 81.647 kg (180 lb)     SpO2: 96%   96%    Wt Readings from Last 3 Encounters:  03/22/14 81.647 kg (180 lb)  09/01/13 90.719 kg (200 lb)  02/21/13 86.183 kg (190 lb)    General:  Appears calm and somewhat uncomfortable and slightly ill appearing Eyes: PERRL, normal lids, irises & conjunctiva ENT: grossly normal hearing, his membranes of her mouth are pink but very  dry Neck: no LAD, masses or thyromegaly Cardiovascular: RRR, no m/r/g. No LE edema. Pedal pulses are present and palpable  Respiratory: CTA bilaterally, no w/r/r. Normal respiratory effort. Abdomen: soft, mild diffuse tenderness particularly in the lower quadrants positive bowel sounds nondistended Skin: no rash or induration seen on limited exam Musculoskeletal: grossly normal tone BUE/BLE Psychiatric: grossly normal mood and affect, speech fluent and appropriate Neurologic: grossly non-focal. Speech clear facial symmetry           Labs on Admission:  Basic Metabolic Panel:  Recent Labs Lab 03/22/14 1137  NA 141  K 3.3*  CL 104  CO2 24  GLUCOSE 102*  BUN 9  CREATININE 1.06  CALCIUM 9.4   Liver Function Tests:  Recent Labs Lab 03/22/14 1137  AST 15  ALT 5  ALKPHOS 57  BILITOT 0.5  PROT 6.9  ALBUMIN 3.8    Recent Labs Lab 03/22/14 1137  LIPASE 17   No results for input(s): AMMONIA in the last 168 hours. CBC:  Recent Labs Lab 03/22/14 1137  WBC 15.2*  NEUTROABS 14.3*  HGB 12.2  HCT 36.5  MCV 95.8  PLT 288   Cardiac Enzymes: No results for input(s): CKTOTAL, CKMB, CKMBINDEX, TROPONINI in the last 168 hours.  BNP (last 3 results) No results for input(s): PROBNP in the last 8760 hours. CBG: No results for input(s): GLUCAP in the last 168 hours.  Radiological Exams on Admission: Ct Head Wo Contrast  03/22/2014   CLINICAL DATA:  Headache and fever  EXAM: CT HEAD WITHOUT CONTRAST  TECHNIQUE: Contiguous axial images were obtained from the base of the skull through the vertex without intravenous contrast.  COMPARISON:  None.  FINDINGS: The ventricles are normal in size and configuration. There is no mass, hemorrhage, extra-axial fluid collection, or midline shift. Gray-white compartments are normal. No acute infarct apparent. Bony calvarium appears intact. The mastoid air cells are clear. There is debris in each external auditory canal.  IMPRESSION: Suspect  cerumen in each external auditory canal. No intracranial mass, hemorrhage, or focal gray -white compartment lesions/acute appearing infarct.   Electronically Signed   By: Bretta BangWilliam  Woodruff M.D.   On: 03/22/2014 12:28   Ct Renal Stone Study  03/22/2014   CLINICAL DATA:  Left flank pain since 3 a.m. today.  Vomiting.  EXAM: CT ABDOMEN AND PELVIS WITHOUT CONTRAST  TECHNIQUE: Multidetector CT imaging of the abdomen and pelvis was performed following the standard protocol without IV contrast.  COMPARISON:  07/28/2012  FINDINGS: There is bibasilar dependent atelectasis. No effusions. Heart is normal size.  Moderate left hydronephrosis. Extensive left perinephric stranding. 5 mm proximal left ureteral stone. No additional ureteral stones. No renal stones. No hydronephrosis on the right.  Prior cholecystectomy. Liver, spleen, pancreas, adrenals have an unremarkable unenhanced appearance. Appendix is visualized and is normal. Stomach, large and small bowel are unremarkable. No free fluid, free air or adenopathy. Aorta is normal caliber.  No acute bony abnormality or focal bone lesion.  IMPRESSION: 5 mm proximal left ureteral stone with moderate hydronephrosis and extensive  perinephric stranding.   Electronically Signed   By: Charlett Nose M.D.   On: 03/22/2014 13:28    Assessment/Plan Principal Problem:   Sepsis: Secondary to pyelonephritis in the setting of obstructive ureteral stone. She is slightly tachycardic with a mildly soft blood pressure and a leukocytosis of 15. She is afebrile. Will provide antibiotics IV fluids, IV antibiotics pain medicine and antiemetic. Monitor vital signs closely. He is scheduled for renal stents this afternoon. Active Problems:    Pyelonephritis: Secondary to acute urethral obstruction from stone. See #1. White count is elevated she is mildly tachycardic with a very slightly soft blood pressure. Will continue IV Cipro. Appreciate urology's assistance. Will monitor closely     Acute urethral obstruction: Scheduled ago to the OR for renal stents this afternoon. Appreciate urology assistance    Urethral stone: See above    Tachycardia: Related to #1. Continue IV fluids and IV antibiotics monitor closely    Leukocytosis; related to #2. Cipro as noted above. Currently she is afebrile and nontoxic appearing. Will monitor closely    Hypokalemia; mild likely related to abdominal pain nausea and vomiting secondary to #1 and #2. Will replete and recheck     Dr Annabell Howells urology  Code Status: full DVT Prophylaxis: Family Communication: none present Disposition Plan: home when ready  Time spent: 65 minutes  [facility]    Patient seen and evaluated and independently examined. Agree with above plan. We'll reassess next a.m. Urology to evaluate for further evaluation.

## 2014-03-22 NOTE — Anesthesia Postprocedure Evaluation (Signed)
  Anesthesia Post-op Note  Patient: Claire Bradley  Procedure(s) Performed: Procedure(s): CYSTOSCOPY WITH STENT PLACEMENT (Left)  Patient Location: PACU  Anesthesia Type:MAC  Level of Consciousness: awake, alert , oriented and patient cooperative  Airway and Oxygen Therapy: Patient Spontanous Breathing  Post-op Pain: none  Post-op Assessment: Post-op Vital signs reviewed, Patient's Cardiovascular Status Stable, Respiratory Function Stable, Patent Airway and No headache  Post-op Vital Signs: Reviewed and stable  Last Vitals:  Filed Vitals:   03/22/14 1700  BP: 93/47  Pulse:   Temp: 37.2 C  Resp: 14    Complications: No apparent anesthesia complications

## 2014-03-23 LAB — CBC
HCT: 31.7 % — ABNORMAL LOW (ref 36.0–46.0)
Hemoglobin: 10.6 g/dL — ABNORMAL LOW (ref 12.0–15.0)
MCH: 32.4 pg (ref 26.0–34.0)
MCHC: 33.4 g/dL (ref 30.0–36.0)
MCV: 96.9 fL (ref 78.0–100.0)
PLATELETS: 231 10*3/uL (ref 150–400)
RBC: 3.27 MIL/uL — ABNORMAL LOW (ref 3.87–5.11)
RDW: 13.6 % (ref 11.5–15.5)
WBC: 28 10*3/uL — AB (ref 4.0–10.5)

## 2014-03-23 LAB — BASIC METABOLIC PANEL
Anion gap: 13 (ref 5–15)
BUN: 10 mg/dL (ref 6–23)
CO2: 23 meq/L (ref 19–32)
Calcium: 8.6 mg/dL (ref 8.4–10.5)
Chloride: 103 mEq/L (ref 96–112)
Creatinine, Ser: 0.86 mg/dL (ref 0.50–1.10)
GFR calc Af Amer: >90 mL/min (ref >90)
GFR, EST NON AFRICAN AMERICAN: 83 mL/min — AB (ref >90)
GLUCOSE: 95 mg/dL (ref 70–99)
POTASSIUM: 3.2 meq/L — AB (ref 3.7–5.3)
Sodium: 139 mEq/L (ref 137–147)

## 2014-03-23 MED ORDER — VANCOMYCIN HCL IN DEXTROSE 1-5 GM/200ML-% IV SOLN
1000.0000 mg | Freq: Three times a day (TID) | INTRAVENOUS | Status: DC
  Administered 2014-03-23 – 2014-03-25 (×6): 1000 mg via INTRAVENOUS
  Filled 2014-03-23 (×9): qty 200

## 2014-03-23 MED ORDER — POTASSIUM CHLORIDE CRYS ER 20 MEQ PO TBCR
40.0000 meq | EXTENDED_RELEASE_TABLET | Freq: Once | ORAL | Status: AC
  Administered 2014-03-23: 40 meq via ORAL
  Filled 2014-03-23: qty 2

## 2014-03-23 MED ORDER — CIPROFLOXACIN IN D5W 400 MG/200ML IV SOLN
400.0000 mg | Freq: Two times a day (BID) | INTRAVENOUS | Status: DC
  Administered 2014-03-23 – 2014-03-25 (×4): 400 mg via INTRAVENOUS
  Filled 2014-03-23 (×5): qty 200

## 2014-03-23 NOTE — Addendum Note (Signed)
Addendum  created 03/23/14 0857 by Despina Hiddenobert J Torrion Witter, CRNA   Modules edited: Notes Section   Notes Section:  File: [id number]

## 2014-03-23 NOTE — Progress Notes (Signed)
Patient c/o pain at IV site angiocath removed.

## 2014-03-23 NOTE — Op Note (Signed)
NAME:  Carman ChingSMITH, Claire Bradley              ACCOUNT NO.:  [id number]  MEDICAL RECORD NO.:  [id number]  LOCATION:  A307                          FACILITY:  [facility]  PHYSICIAN:  Excell SeltzerJohn J. Annabell HowellsWrenn, M.D.    DATE OF BIRTH:  March 08, 1971  DATE OF PROCEDURE:  03/22/2014 DATE OF DISCHARGE:                              OPERATIVE REPORT   PROCEDURE:  Cystoscopy with placement of left double-J stent.  PREOPERATIVE DIAGNOSIS:  Left proximal ureteral stone with urinary tract infection.  POSTOPERATIVE DIAGNOSIS:  Left proximal stone with pyonephrosis.  SURGEON:  Excell SeltzerJohn J. Annabell HowellsWrenn, M.D.  ANESTHESIA:  MAC and local.  SPECIMEN:  None.  DRAINS:  A 6-French 26 cm left double-J stent.  COMPLICATIONS:  None.  INDICATIONS:  Ms. Claire Bradley is a 43 year old white female who presented to the emergency room with the onset this morning of left flank pain.  She was found to have UTI and an obstructing 5 mm left proximal stone.  It was felt because of the infection and her persistent pain that stenting was indicated.  FINDINGS AND PROCEDURE:  She was given Cipro.  She was taken to the operating room.  She was placed on the table in lithotomy position and given sedation as needed.  She was fitted with PAS hose.  Her perineum and genitalia were prepped with Betadine solution.  She was draped in usual sterile fashion.  Her urethra was instilled with 10 mL of 2% lidocaine jelly.  She then underwent cystoscopy with a 22-French scope and 12-degree lens. Examination revealed a normal urethra.  She did have a cystocele with deep bladder descent.  She had mild trabeculation.  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  The ureteral orifices were unremarkable.  The left ureteral orifice was cannulated with a sensor guidewire, which was passed through the kidney by the stone, which was visible in the proximal ureter.  Once the stent was to the kidney, she was  noted to have purulent efflux from the orifice.  A 6-French 26-cm double-J stent without string was passed to the kidney under fluoroscopic guidance.  The wire was removed leaving good coil in the kidney and good coil in the bladder.  The bladder was drained.  The patient was taken down from lithotomy position.  Her anesthetic was reversed.  She was moved to the recovery room in stable condition. There were no complications.     Excell SeltzerJohn J. Annabell HowellsWrenn, M.D.     JJW/MEDQ  D:  03/22/2014  T:  03/23/2014  Job:  [id number]

## 2014-03-23 NOTE — Progress Notes (Signed)
Patient encouraged to cough and deep breathe. Patient ambulated in hallway.

## 2014-03-23 NOTE — Progress Notes (Signed)
Patient arrive to unit via stretcher. Alert and oriented.

## 2014-03-23 NOTE — Progress Notes (Signed)
Notified Dr. Cena BentonVega patient temp 103. Medicated with tylenol po. New order for Cipro.

## 2014-03-23 NOTE — Progress Notes (Signed)
[facility] PROGRESS NOTE  Claire Bradley [id number] DOB: 08-Feb-1971 DOA: 03/22/2014 PCP: No PCP Per Patient  Assessment/Plan: Sepsis: Secondary to pyelonephritis in the setting of obstructive ureteral stone.  - Urologist on board and patient is status post cystoscopy with placement of left double-J stent. - Continue ciprofloxacin, will add vancomycin   Pyelonephritis: Secondary to acute urethral obstruction from stone. See #1.  - Continue broad-spectrum antibiotics. Await urine culture results   Acute urethral obstruction:  - As mentioned above patient is status post cystoscopy with placement of left double-J stent. Urologist on board   Urethral stone: See above   Tachycardia: Related to #1. Continue IV fluids and IV antibiotics monitor closely - Resolving   Leukocytosis; related to #2. Cipro as noted above. Currently she is afebrile and nontoxic appearing. Will monitor closely   Hypokalemia - We'll replace orally and reassess next a.m.  Code Status: full Family Communication: None at bedside  Disposition Plan: Pending improvement in condition   Consultants:  Urologist  Procedures:  As listed above  Antibiotics:  cipro  Vancomycin  HPI/Subjective: Pt has no new complaints. Inquiring about discharge  Objective: Filed Vitals:   03/23/14 1259  BP:   Pulse:   Temp: 103 F (39.4 C)  Resp:     Intake/Output Summary (Last 24 hours) at 03/23/14 1342 Last data filed at 03/23/14 1300  Gross per 24 hour  Intake    540 ml  Output      0 ml  Net    540 ml   Filed Weights   03/22/14 1110 03/22/14 1534 03/22/14 1845  Weight: 81.647 kg (180 lb) 81.647 kg (180 lb) 89.948 kg (198 lb 4.8 oz)    Exam:   General:  Patient in no acute distress, alert and awake  Cardiovascular: Regular rate and rhythm, no murmurs or rubs  Respiratory: No increased work of breathing, no wheezes  Abdomen: Soft, nondistended, nontender  Musculoskeletal: No  cyanosis or clubbing   Data Reviewed: Basic Metabolic Panel:  Recent Labs Lab 03/22/14 1137 03/23/14 0534  NA 141 139  K 3.3* 3.2*  CL 104 103  CO2 24 23  GLUCOSE 102* 95  BUN 9 10  CREATININE 1.06 0.86  CALCIUM 9.4 8.6   Liver Function Tests:  Recent Labs Lab 03/22/14 1137  AST 15  ALT 5  ALKPHOS 57  BILITOT 0.5  PROT 6.9  ALBUMIN 3.8    Recent Labs Lab 03/22/14 1137  LIPASE 17   No results for input(s): AMMONIA in the last 168 hours. CBC:  Recent Labs Lab 03/22/14 1137 03/23/14 0534  WBC 15.2* 28.0*  NEUTROABS 14.3*  --   HGB 12.2 10.6*  HCT 36.5 31.7*  MCV 95.8 96.9  PLT 288 231   Cardiac Enzymes: No results for input(s): CKTOTAL, CKMB, CKMBINDEX, TROPONINI in the last 168 hours. BNP (last 3 results) No results for input(s): PROBNP in the last 8760 hours. CBG: No results for input(s): GLUCAP in the last 168 hours.  No results found for this or any previous visit (from the past 240 hour(s)).   Studies: Ct Head Wo Contrast  03/22/2014   CLINICAL DATA:  Headache and fever  EXAM: CT HEAD WITHOUT CONTRAST  TECHNIQUE: Contiguous axial images were obtained from the base of the skull through the vertex without intravenous contrast.  COMPARISON:  None.  FINDINGS: The ventricles are normal in size and configuration. There is no mass, hemorrhage, extra-axial fluid collection, or midline shift. Gray-white compartments are normal.  No acute infarct apparent. Bony calvarium appears intact. The mastoid air cells are clear. There is debris in each external auditory canal.  IMPRESSION: Suspect cerumen in each external auditory canal. No intracranial mass, hemorrhage, or focal gray -white compartment lesions/acute appearing infarct.   Electronically Signed   By: Bretta BangWilliam  Woodruff M.D.   On: 03/22/2014 12:28   Dg C-arm 1-60 Min-no Report  03/22/2014   CLINICAL DATA: left ureteral stone   C-ARM 1-60 MINUTES  Fluoroscopy was utilized by the requesting physician.  No  radiographic  interpretation.    Ct Renal Stone Study  03/22/2014   CLINICAL DATA:  Left flank pain since 3 a.m. today.  Vomiting.  EXAM: CT ABDOMEN AND PELVIS WITHOUT CONTRAST  TECHNIQUE: Multidetector CT imaging of the abdomen and pelvis was performed following the standard protocol without IV contrast.  COMPARISON:  07/28/2012  FINDINGS: There is bibasilar dependent atelectasis. No effusions. Heart is normal size.  Moderate left hydronephrosis. Extensive left perinephric stranding. 5 mm proximal left ureteral stone. No additional ureteral stones. No renal stones. No hydronephrosis on the right.  Prior cholecystectomy. Liver, spleen, pancreas, adrenals have an unremarkable unenhanced appearance. Appendix is visualized and is normal. Stomach, large and small bowel are unremarkable. No free fluid, free air or adenopathy. Aorta is normal caliber.  No acute bony abnormality or focal bone lesion.  IMPRESSION: 5 mm proximal left ureteral stone with moderate hydronephrosis and extensive perinephric stranding.   Electronically Signed   By: Charlett NoseKevin  Dover M.D.   On: 03/22/2014 13:28    Scheduled Meds: . ciprofloxacin  400 mg Intravenous Q12H   Continuous Infusions:   Principal Problem:   Sepsis Active Problems:   Pyelonephritis   Acute urethral obstruction   Urethral stone   Tachycardia   Leukocytosis   Hypokalemia    Time spent: > 35 minutes    Penny PiaVEGA, Claire Bradley  [facility] Pager [phone]. If 7PM-7AM, please contact night-coverage at [email or web address], password [email or web address] 03/23/2014, 1:42 PM  LOS: 1 day

## 2014-03-23 NOTE — Anesthesia Postprocedure Evaluation (Signed)
  Anesthesia Post-op Note  Patient: Claire Bradley  Procedure(s) Performed: Procedure(s): CYSTOSCOPY WITH STENT PLACEMENT (Left)  Patient Location: 307  Anesthesia Type:MAC  Level of Consciousness: awake, alert , oriented and patient cooperative  Airway and Oxygen Therapy: Patient Spontanous Breathing  Post-op Pain: 2 /10, mild  Post-op Assessment: Post-op Vital signs reviewed, Patient's Cardiovascular Status Stable, Respiratory Function Stable, Patent Airway, No signs of Nausea or vomiting, Adequate PO intake and Pain level controlled  Post-op Vital Signs: Reviewed and stable  Last Vitals:  Filed Vitals:   03/23/14 0548  BP: 102/52  Pulse: 73  Temp: 37.1 C  Resp: 20    Complications: No apparent anesthesia complications

## 2014-03-23 NOTE — Progress Notes (Signed)
Notice and order for potassium runs that had been held since 1500,  Called on call MD to see if it needed to be reordered.  MD did not reorder the potassium and stated that we would see what the patients level was with morning labs and reevaluate.

## 2014-03-23 NOTE — Progress Notes (Addendum)
ANTIBIOTIC CONSULT NOTE Pharmacy Consult for Cipro / Vancomycin Indication: UTI / Sepsis  Allergies  Allergen Reactions  . Penicillins Nausea And Vomiting    Patient Measurements: Height: 5\' 9"  (175.3 cm) Weight: 198 lb 4.8 oz (89.948 kg) IBW/kg (Calculated) : 66.2  Vital Signs: Temp: 103 F (39.4 C) (12/19 1259) Temp Source: Oral (12/19 1017) BP: 106/58 mmHg (12/19 1017) Pulse Rate: 94 (12/19 1017) Intake/Output from previous day: 12/18 0701 - 12/19 0700 In: 300 [I.V.:300] Out: -  Intake/Output from this shift: Total I/O In: 240 [P.O.:240] Out: -   Labs:  Recent Labs  03/22/14 1137 03/23/14 0534  WBC 15.2* 28.0*  HGB 12.2 10.6*  PLT 288 231  CREATININE 1.06 0.86   Estimated Creatinine Clearance: 102.9 mL/min (by C-G formula based on Cr of 0.86). No results for input(s): VANCOTROUGH, VANCOPEAK, VANCORANDOM, GENTTROUGH, GENTPEAK, GENTRANDOM, TOBRATROUGH, TOBRAPEAK, TOBRARND, AMIKACINPEAK, AMIKACINTROU, AMIKACIN in the last 72 hours.   Microbiology: No results found for this or any previous visit (from the past 720 hour(s)).  Anti-infectives    Start     Dose/Rate Route Frequency Ordered Stop   03/23/14 1317  ciprofloxacin (CIPRO) IVPB 400 mg     400 mg200 mL/hr over 60 Minutes Intravenous Every 12 hours 03/23/14 1317     03/22/14 1355  ciprofloxacin (CIPRO) IVPB 400 mg     400 mg200 mL/hr over 60 Minutes Intravenous 60 min pre-op 03/22/14 1355 03/22/14 1546   03/22/14 1300  cefTRIAXone (ROCEPHIN) 1 g in dextrose 5 % 50 mL IVPB     1 g100 mL/hr over 30 Minutes Intravenous  Once 03/22/14 1255 03/22/14 1446   03/22/14 0000  ciprofloxacin (CIPRO) 500 MG tablet     500 mg Oral 2 times daily 03/22/14 1653       Assessment: Okay for Protocol, received initial dose 03/22/14.  Goal of Therapy:  Eradicate infection.  Vancomycin trough 15-20  Plan:  Vancomycin 1000mg  IV every 8 hours. Trough at steady state. Cipro 400mg  IV every 12 hours. Follow up culture  results  Mady GemmaHayes, Selin Eisler R 03/23/2014,1:31 PM

## 2014-03-24 DIAGNOSIS — N12 Tubulo-interstitial nephritis, not specified as acute or chronic: Secondary | ICD-10-CM

## 2014-03-24 DIAGNOSIS — E876 Hypokalemia: Secondary | ICD-10-CM

## 2014-03-24 DIAGNOSIS — N368 Other specified disorders of urethra: Secondary | ICD-10-CM

## 2014-03-24 LAB — BASIC METABOLIC PANEL
ANION GAP: 11 (ref 5–15)
BUN: 8 mg/dL (ref 6–23)
CALCIUM: 8.8 mg/dL (ref 8.4–10.5)
CHLORIDE: 101 meq/L (ref 96–112)
CO2: 25 meq/L (ref 19–32)
CREATININE: 0.77 mg/dL (ref 0.50–1.10)
GFR calc Af Amer: >90 mL/min (ref >90)
GFR calc non Af Amer: >90 mL/min (ref >90)
Glucose, Bld: 105 mg/dL — ABNORMAL HIGH (ref 70–99)
Potassium: 3.5 mEq/L — ABNORMAL LOW (ref 3.7–5.3)
Sodium: 137 mEq/L (ref 137–147)

## 2014-03-24 LAB — CBC
HEMATOCRIT: 31.5 % — AB (ref 36.0–46.0)
Hemoglobin: 10.6 g/dL — ABNORMAL LOW (ref 12.0–15.0)
MCH: 32.4 pg (ref 26.0–34.0)
MCHC: 33.7 g/dL (ref 30.0–36.0)
MCV: 96.3 fL (ref 78.0–100.0)
Platelets: 196 10*3/uL (ref 150–400)
RBC: 3.27 MIL/uL — ABNORMAL LOW (ref 3.87–5.11)
RDW: 13.5 % (ref 11.5–15.5)
WBC: 13.6 10*3/uL — ABNORMAL HIGH (ref 4.0–10.5)

## 2014-03-24 MED ORDER — POTASSIUM CHLORIDE CRYS ER 20 MEQ PO TBCR
40.0000 meq | EXTENDED_RELEASE_TABLET | Freq: Once | ORAL | Status: AC
  Administered 2014-03-24: 40 meq via ORAL
  Filled 2014-03-24: qty 2

## 2014-03-24 MED ORDER — POTASSIUM CHLORIDE IN NACL 20-0.9 MEQ/L-% IV SOLN
INTRAVENOUS | Status: DC
  Administered 2014-03-24: 20:00:00 via INTRAVENOUS

## 2014-03-24 MED ORDER — HEPARIN SODIUM (PORCINE) 5000 UNIT/ML IJ SOLN
5000.0000 [IU] | Freq: Three times a day (TID) | INTRAMUSCULAR | Status: DC
  Administered 2014-03-24 – 2014-03-25 (×2): 5000 [IU] via SUBCUTANEOUS
  Filled 2014-03-24 (×2): qty 1

## 2014-03-24 MED ORDER — SODIUM CHLORIDE 0.9 % IV SOLN: INTRAVENOUS | Status: DC

## 2014-03-24 NOTE — Progress Notes (Signed)
Nutrition Brief Note  Patient identified on the Malnutrition Screening Tool (MST) Report  Wt Readings from Last 15 Encounters:  03/22/14 198 lb 4.8 oz (89.948 kg)  09/01/13 200 lb (90.719 kg)  02/21/13 190 lb (86.183 kg)  08/18/12 190 lb (86.183 kg)  07/28/12 180 lb (81.647 kg)  06/08/12 180 lb (81.647 kg)  09/22/11 200 lb (90.719 kg)   Pt admitted with sepsis secondary to pyelonephritis. She is s/p cystoscopy with stent placement on 03/23/14.   Pt has just been advanced to a regular diet. Wt has been stable over the past year. Noted UBW between 190-200#.   Body mass index is 29.27 kg/(m^2). Patient meets criteria for overweight based on current BMI.   Current diet order is regular, patient is consuming approximately n/a% of meals at this time. Labs and medications reviewed.   No nutrition interventions warranted at this time. If nutrition issues arise, please consult RD.   Nithin Demeo A. Mayford KnifeWilliams, RD, LDN Pager: [phone]

## 2014-03-24 NOTE — Progress Notes (Signed)
[facility] PROGRESS NOTE  Claire Bradley ZOX:[id number] DOB: 09-Feb-1971 DOA: 03/22/2014 PCP: No PCP Per Patient  Assessment/Plan: 1. Sepsis. Secondary to pyelonephritis and obstructed left ureter. Patient seen by urology and underwent cystoscopy with placement of left double-J stent. Follow-up urine culture. She still febrile. Continue on broad-spectrum antibiotics for now. Continue on IV fluids. 2. Pyelonephritis. Secondary to ureteral obstruction status post stent placement. Continue current antibiotics. 3. Left ureteral stone with moderate hydronephrosis. Status post cystoscopy with stent placement. 4. Hypokalemia. Replace 5. Leukocytosis. Improved after decompression of left ureter. Continue current treatments.  Code Status: full code Family Communication: discussed with patient Disposition Plan: discharge home once improved.   Consultants:  Urology, Dr. Annabell HowellsWrenn  Procedures: Cystoscopy with placement of left double-J stent on 12/18  Antibiotics:  cipro 12/19-  Vancomycin 12/19-  HPI/Subjective: Feeling chills today, no nausea or vomiting  Objective: Filed Vitals:   03/24/14 1518  BP: 114/66  Pulse: 88  Temp: 98.1 F (36.7 C)  Resp: 16    Intake/Output Summary (Last 24 hours) at 03/24/14 1858 Last data filed at 03/24/14 1800  Gross per 24 hour  Intake   1480 ml  Output   2250 ml  Net   -770 ml   Filed Weights   03/22/14 1110 03/22/14 1534 03/22/14 1845  Weight: 81.647 kg (180 lb) 81.647 kg (180 lb) 89.948 kg (198 lb 4.8 oz)    Exam:   General:  Has rigors  Cardiovascular: s1, s2 rrr  Respiratory: cta b  Abdomen: soft, nt, nd, bs+  Musculoskeletal: no edema b/l   Data Reviewed: Basic Metabolic Panel:  Recent Labs Lab 03/22/14 1137 03/23/14 0534 03/24/14 0626  NA 141 139 137  K 3.3* 3.2* 3.5*  CL 104 103 101  CO2 24 23 25   GLUCOSE 102* 95 105*  BUN 9 10 8   CREATININE 1.06 0.86 0.77  CALCIUM 9.4 8.6 8.8   Liver Function  Tests:  Recent Labs Lab 03/22/14 1137  AST 15  ALT 5  ALKPHOS 57  BILITOT 0.5  PROT 6.9  ALBUMIN 3.8    Recent Labs Lab 03/22/14 1137  LIPASE 17   No results for input(s): AMMONIA in the last 168 hours. CBC:  Recent Labs Lab 03/22/14 1137 03/23/14 0534 03/24/14 0626  WBC 15.2* 28.0* 13.6*  NEUTROABS 14.3*  --   --   HGB 12.2 10.6* 10.6*  HCT 36.5 31.7* 31.5*  MCV 95.8 96.9 96.3  PLT 288 231 196   Cardiac Enzymes: No results for input(s): CKTOTAL, CKMB, CKMBINDEX, TROPONINI in the last 168 hours. BNP (last 3 results) No results for input(s): PROBNP in the last 8760 hours. CBG: No results for input(s): GLUCAP in the last 168 hours.  Recent Results (from the past 240 hour(s))  Urine culture     Status: None (Preliminary result)   Collection Time: 03/22/14 12:56 PM  Result Value Ref Range Status   Specimen Description URINE, CLEAN CATCH  Final   Special Requests NONE  Final   Culture  Setup Time   Final    03/22/2014 23:56 Performed at [facility]    Colony Count PENDING  Incomplete   Culture   Final    Culture reincubated for better growth Performed at [facility]    Report Status PENDING  Incomplete     Studies: No results found.  Scheduled Meds: . ciprofloxacin  400 mg Intravenous Q12H  . vancomycin  1,000 mg Intravenous Q8H   Continuous Infusions:  Principal Problem:   Sepsis Active Problems:   Pyelonephritis   Acute urethral obstruction   Urethral stone   Tachycardia   Leukocytosis   Hypokalemia    Time spent: 35mins    Claire Bradley  [facility] Pager [phone]. If 7PM-7AM, please contact night-coverage at [email or web address], password [email or web address] 03/24/2014, 6:58 PM  LOS: 2 days

## 2014-03-25 DIAGNOSIS — R109 Unspecified abdominal pain: Secondary | ICD-10-CM | POA: Insufficient documentation

## 2014-03-25 LAB — CBC
HCT: 31.3 % — ABNORMAL LOW (ref 36.0–46.0)
Hemoglobin: 10.3 g/dL — ABNORMAL LOW (ref 12.0–15.0)
MCH: 31.3 pg (ref 26.0–34.0)
MCHC: 32.9 g/dL (ref 30.0–36.0)
MCV: 95.1 fL (ref 78.0–100.0)
PLATELETS: 220 10*3/uL (ref 150–400)
RBC: 3.29 MIL/uL — AB (ref 3.87–5.11)
RDW: 13.2 % (ref 11.5–15.5)
WBC: 8.3 10*3/uL (ref 4.0–10.5)

## 2014-03-25 LAB — BASIC METABOLIC PANEL
Anion gap: 12 (ref 5–15)
BUN: 6 mg/dL (ref 6–23)
CO2: 24 mEq/L (ref 19–32)
CREATININE: 0.79 mg/dL (ref 0.50–1.10)
Calcium: 9 mg/dL (ref 8.4–10.5)
Chloride: 100 mEq/L (ref 96–112)
GFR calc Af Amer: >90 mL/min (ref >90)
Glucose, Bld: 109 mg/dL — ABNORMAL HIGH (ref 70–99)
POTASSIUM: 3.5 meq/L — AB (ref 3.7–5.3)
Sodium: 136 mEq/L — ABNORMAL LOW (ref 137–147)

## 2014-03-25 LAB — URINE CULTURE: Colony Count: 70000

## 2014-03-25 MED ORDER — ACYCLOVIR 5 % EX OINT
TOPICAL_OINTMENT | Freq: Four times a day (QID) | CUTANEOUS | Status: DC
  Filled 2014-03-25: qty 30

## 2014-03-25 MED ORDER — OXYCODONE-ACETAMINOPHEN 5-325 MG PO TABS
1.0000 | ORAL_TABLET | Freq: Four times a day (QID) | ORAL | Status: DC | PRN
  Administered 2014-03-25: 1 via ORAL
  Filled 2014-03-25: qty 1

## 2014-03-25 MED ORDER — STERILE WATER FOR IRRIGATION IR SOLN
Status: DC | PRN
  Administered 2014-03-22: 3000 mL

## 2014-03-25 MED ORDER — ACYCLOVIR 5 % EX CREA
TOPICAL_CREAM | Freq: Every day | CUTANEOUS | Status: DC
  Filled 2014-03-25: qty 5

## 2014-03-25 MED ORDER — ACYCLOVIR 5 % EX OINT: TOPICAL_OINTMENT | CUTANEOUS | Status: DC

## 2014-03-25 MED ORDER — POTASSIUM CHLORIDE CRYS ER 20 MEQ PO TBCR
40.0000 meq | EXTENDED_RELEASE_TABLET | Freq: Once | ORAL | Status: AC
  Administered 2014-03-25: 40 meq via ORAL
  Filled 2014-03-25: qty 2

## 2014-03-25 MED ORDER — ACYCLOVIR 5 % EX OINT
TOPICAL_OINTMENT | Freq: Four times a day (QID) | CUTANEOUS | Status: DC | PRN
  Filled 2014-03-25: qty 30

## 2014-03-25 MED ORDER — CIPROFLOXACIN HCL 500 MG PO TABS: 500.0000 mg | ORAL_TABLET | Freq: Two times a day (BID) | ORAL | Status: DC

## 2014-03-25 MED ORDER — LIDOCAINE HCL 2 % EX GEL
CUTANEOUS | Status: DC | PRN
  Administered 2014-03-22: 1

## 2014-03-25 NOTE — Progress Notes (Signed)
Patient received discharge instructions and scripts and had no further questions or concerns.  Patient in stable condition at discharge. Patient's IV removed and was clean, dry, intact at removal.  Patient escorted to vehicle via wheelchair by nurse.

## 2014-03-25 NOTE — Care Management Note (Signed)
    Page 1 of 1   03/25/2014     4:00:05 PM CARE MANAGEMENT NOTE 03/25/2014  Patient:  Claire Bradley,Claire Bradley   Account Number:  [id number]  Date Initiated:  03/25/2014  Documentation initiated by:  Anibal HendersonBOLDEN,Hermenegildo Clausen  Subjective/Objective Assessment:   admitted with sepsis, and obstructive kidney stone. Had cysto and  and stent. On IV ABX. Pt is from home, is independent, and is returning home today at D/C     Action/Plan:   No needs identified   Anticipated DC Date:  03/25/2014   Anticipated DC Plan:  HOME/SELF CARE      DC Planning Services  CM consult      Choice offered to / List presented to:             Status of service:  Completed, signed off Medicare Important Message given?   (If response is "NO", the following Medicare IM given date fields will be blank) Date Medicare IM given:   Medicare IM given by:   Date Additional Medicare IM given:   Additional Medicare IM given by:    Discharge Disposition:  HOME/SELF CARE  Per UR Regulation:  Reviewed for med. necessity/level of care/duration of stay  If discussed at Long Length of Stay Meetings, dates discussed:    Comments:  03/25/14 1500 Anibal HendersonGeneva Yaser Harvill RN/CM

## 2014-03-25 NOTE — Care Management Utilization Note (Signed)
UR completed 

## 2014-03-25 NOTE — Discharge Summary (Signed)
Physician Discharge Summary  Claire Bradley ZOX:[id number] DOB: Aug 30, 1970 DOA: 03/22/2014  PCP: No PCP Per Patient  Admit date: 03/22/2014 Discharge date: 03/25/2014  Time spent: 40 minutes  Recommendations for Outpatient Follow-up:  1. Follow up with Dr Annabell Howells 2 weeks. His office will contact to make appointment.  2. Follow urine culture  Discharge Diagnoses:  Principal Problem:   Sepsis Active Problems:   Pyelonephritis   Acute urethral obstruction   Urethral stone   Tachycardia   Leukocytosis   Hypokalemia   Discharge Condition: stable   Regular die  [facility] Weights   03/22/14 1110 03/22/14 1534 03/22/14 1845  Weight: 81.647 kg (180 lb) 81.647 kg (180 lb) 89.948 kg (198 lb 4.8 oz)    History of present illness:  Claire Bradley is a 43 y.o. female past medical history that includes cholelithiasis and kidney stones presented to emergency department on 03/22/14 with chief complaint of sudden onset abdominal pain nausea chills and subjective fever. Initial evaluation in the emergency department revealed ureteral stone with obstruction and hydronephrosis. A she reported sudden onset around 3:30 AM of left flank pain. Associated symptoms included nausea vomiting fever chills. Pain was constant and described as an ache radiating to the "whole left side of her body. She denied chest pain palpitation shortness of breath. She denied bloody or coffee ground emesis. She denied dysuria hematuria frequency or urgency.  Workup in the emergency department included a complete metabolic panel significant for potassium of 3.3 and serum glucose of 102, complete blood count significant for WBCs of 15.2 relative neutrophils 94% absolute neutrophils 14.3, urinalysis with many bacteria 3-6 RBCs 11-20 WBCs small leukocytes negative nitrites. EKG with sinus tachycardia rate of 102 and CT anal stones study reveals 5 mm proximal left ureteral stone with moderate hydronephrosis and extensive perinephric  stranding. In the emergency department she was afebrile with a slightly soft blood pressure and tachycardia at 102. She was not hypoxic  Hospital Course:  1. Sepsis. Secondary to pyelonephritis and obstructed left ureter. Patient seen by urology and underwent cystoscopy with placement of left double-J stent. Provided broad spectrum antibiotics.  At discharge afebrile and non-toxic.  Urine culture pending at discharge. Will follow. Continue cipro at discharge per urology for 10 more days. 2. Pyelonephritis. Secondary to ureteral obstruction status post stent placement. See #1. 3. Left ureteral stone with moderate hydronephrosis. Status post cystoscopy with stent placement. Follow up with urology 4. Hypokalemia. Mild. Recommend bmet at follow up 5. Leukocytosis. Resolved at discharge   Procedures: Cystoscopy with placement of left double-J stent on 12/18  Consultations: Dr Annabell Howells                    Discharge Exam: Filed Vitals:   03/25/14 0547  BP: 112/68  Pulse: 79  Temp: 99.5 F (37.5 C)  Resp: 20    General: appears comfortable Cardiovascular: RRR No MGR  Respiratory: normal effort BS clear no wheeze  Discharge Instructions   Current Discharge Medication List    START taking these medications   Details  acyclovir ointment (ZOVIRAX) 5 % Apply to areas four times daily Qty: 1 g, Refills: 1    ciprofloxacin (CIPRO) 500 MG tablet Take 1 tablet (500 mg total) by mouth 2 (two) times daily. Qty: 20 tablet, Refills: 0    oxyCODONE-acetaminophen (ROXICET) 5-325 MG per tablet Take 1 tablet by mouth every 4 (four) hours as needed for severe pain. Qty: 30 tablet, Refills: 0    phenazopyridine (PYRIDIUM)  200 MG tablet Take 1 tablet (200 mg total) by mouth 3 (three) times daily as needed for pain. Qty: 15 tablet, Refills: 1      STOP taking these medications     azithromycin (ZITHROMAX Z-PAK) 250 MG tablet        Allergies  Allergen Reactions  . Penicillins  Nausea And Vomiting   Follow-up Information    Follow up with Anner CreteWRENN,JOHN J, MD.   Specialty:  Urology   Why:  My office will call to arrange your next procedure.    Contact information:   [address] [place] [address] [phone]        The results of significant diagnostics from this hospitalization (including imaging, microbiology, ancillary and laboratory) are listed below for reference.    Significant Diagnostic Studies: Ct Head Wo Contrast  03/22/2014   CLINICAL DATA:  Headache and fever  EXAM: CT HEAD WITHOUT CONTRAST  TECHNIQUE: Contiguous axial images were obtained from the base of the skull through the vertex without intravenous contrast.  COMPARISON:  None.  FINDINGS: The ventricles are normal in size and configuration. There is no mass, hemorrhage, extra-axial fluid collection, or midline shift. Gray-white compartments are normal. No acute infarct apparent. Bony calvarium appears intact. The mastoid air cells are clear. There is debris in each external auditory canal.  IMPRESSION: Suspect cerumen in each external auditory canal. No intracranial mass, hemorrhage, or focal gray -white compartment lesions/acute appearing infarct.   Electronically Signed   By: Bretta BangWilliam  Woodruff M.D.   On: 03/22/2014 12:28   Dg C-arm 1-60 Min-no Report  03/22/2014   CLINICAL DATA: left ureteral stone   C-ARM 1-60 MINUTES  Fluoroscopy was utilized by the requesting physician.  No radiographic  interpretation.    Ct Renal Stone Study  03/22/2014   CLINICAL DATA:  Left flank pain since 3 a.m. today.  Vomiting.  EXAM: CT ABDOMEN AND PELVIS WITHOUT CONTRAST  TECHNIQUE: Multidetector CT imaging of the abdomen and pelvis was performed following the standard protocol without IV contrast.  COMPARISON:  07/28/2012  FINDINGS: There is bibasilar dependent atelectasis. No effusions. Heart is normal size.  Moderate left hydronephrosis. Extensive left perinephric stranding. 5 mm proximal left ureteral stone.  No additional ureteral stones. No renal stones. No hydronephrosis on the right.  Prior cholecystectomy. Liver, spleen, pancreas, adrenals have an unremarkable unenhanced appearance. Appendix is visualized and is normal. Stomach, large and small bowel are unremarkable. No free fluid, free air or adenopathy. Aorta is normal caliber.  No acute bony abnormality or focal bone lesion.  IMPRESSION: 5 mm proximal left ureteral stone with moderate hydronephrosis and extensive perinephric stranding.   Electronically Signed   By: Charlett NoseKevin  Dover M.D.   On: 03/22/2014 13:28    Microbiology: Recent Results (from the past 240 hour(s))  Urine culture     Status: None   Collection Time: 03/22/14 12:21 PM  Result Value Ref Range Status   Specimen Description URINE, CLEAN CATCH  Final   Special Requests NONE  Final   Culture  Setup Time   Final    03/22/2014 23:56 Performed at [facility]    Colony Count   Final    70,000 COLONIES/ML Performed at [facility]    Culture   Final    Multiple bacterial morphotypes present, none predominant. Suggest appropriate recollection if clinically indicated. Performed at [facility]    Report Status 03/25/2014 FINAL  Final     Labs: Basic Metabolic Panel:  Recent Labs Lab 03/22/14 1137 03/23/14 0534 03/24/14 0626 03/25/14 0526  NA 141 139 137 136*  K 3.3* 3.2* 3.5* 3.5*  CL 104 103 101 100  CO2 24 23 25 24   GLUCOSE 102* 95 105* 109*  BUN 9 10 8 6   CREATININE 1.06 0.86 0.77 0.79  CALCIUM 9.4 8.6 8.8 9.0   Liver Function Tests:  Recent Labs Lab 03/22/14 1137  AST 15  ALT 5  ALKPHOS 57  BILITOT 0.5  PROT 6.9  ALBUMIN 3.8    Recent Labs Lab 03/22/14 1137  LIPASE 17   No results for input(s): AMMONIA in the last 168 hours. CBC:  Recent Labs Lab 03/22/14 1137 03/23/14 0534 03/24/14 0626 03/25/14 0526  WBC 15.2* 28.0* 13.6* 8.3  NEUTROABS 14.3*  --   --   --   HGB 12.2 10.6* 10.6* 10.3*  HCT 36.5 31.7* 31.5*  31.3*  MCV 95.8 96.9 96.3 95.1  PLT 288 231 196 220   Cardiac Enzymes: No results for input(s): CKTOTAL, CKMB, CKMBINDEX, TROPONINI in the last 168 hours. BNP: BNP (last 3 results) No results for input(s): PROBNP in the last 8760 hours. CBG: No results for input(s): GLUCAP in the last 168 hours.     SignedGwenyth Bender:  Raneem Mendolia M  [facility] 03/25/2014, 12:52 PM

## 2014-03-26 ENCOUNTER — Encounter (HOSPITAL_COMMUNITY): Payer: Self-pay | Admitting: Urology

## 2014-04-09 ENCOUNTER — Other Ambulatory Visit: Payer: Self-pay | Admitting: Urology

## 2014-04-10 ENCOUNTER — Encounter (HOSPITAL_BASED_OUTPATIENT_CLINIC_OR_DEPARTMENT_OTHER): Payer: Self-pay | Admitting: *Deleted

## 2014-04-10 NOTE — Progress Notes (Signed)
NPO AFTER MN. ARRIVE AT 1115. CURRENT LAB RESULTS IN CHART AND EPIC. MAY TAKE OXYCODONE IF NEEDED W/ SIPS OF WATER AM DOS.

## 2014-04-11 ENCOUNTER — Encounter (HOSPITAL_BASED_OUTPATIENT_CLINIC_OR_DEPARTMENT_OTHER): Payer: Self-pay | Admitting: *Deleted

## 2014-04-11 ENCOUNTER — Ambulatory Visit (HOSPITAL_BASED_OUTPATIENT_CLINIC_OR_DEPARTMENT_OTHER): Payer: MEDICAID | Admitting: Anesthesiology

## 2014-04-11 ENCOUNTER — Ambulatory Visit (HOSPITAL_BASED_OUTPATIENT_CLINIC_OR_DEPARTMENT_OTHER)
Admission: RE | Admit: 2014-04-11 | Discharge: 2014-04-11 | Disposition: A | Discharge status: Home / Self Care | Payer: Self-pay | Source: Ambulatory Visit | Attending: Urology | Admitting: Urology

## 2014-04-11 ENCOUNTER — Encounter (HOSPITAL_BASED_OUTPATIENT_CLINIC_OR_DEPARTMENT_OTHER)
Admission: RE | Disposition: A | Discharge status: Home / Self Care | Payer: Self-pay | Source: Ambulatory Visit | Attending: Urology

## 2014-04-11 DIAGNOSIS — Z87442 Personal history of urinary calculi: Secondary | ICD-10-CM | POA: Insufficient documentation

## 2014-04-11 DIAGNOSIS — N201 Calculus of ureter: Secondary | ICD-10-CM | POA: Insufficient documentation

## 2014-04-11 DIAGNOSIS — N12 Tubulo-interstitial nephritis, not specified as acute or chronic: Secondary | ICD-10-CM | POA: Insufficient documentation

## 2014-04-11 DIAGNOSIS — F17210 Nicotine dependence, cigarettes, uncomplicated: Secondary | ICD-10-CM | POA: Insufficient documentation

## 2014-04-11 DIAGNOSIS — N368 Other specified disorders of urethra: Secondary | ICD-10-CM | POA: Insufficient documentation

## 2014-04-11 DIAGNOSIS — Z880 Allergy status to penicillin: Secondary | ICD-10-CM | POA: Insufficient documentation

## 2014-04-11 HISTORY — DX: Adverse effect of unspecified anesthetic, initial encounter: T41.45XA

## 2014-04-11 HISTORY — DX: Personal history of other diseases of the female genital tract: Z87.42

## 2014-04-11 HISTORY — PX: CYSTOSCOPY/RETROGRADE/URETEROSCOPY/STONE EXTRACTION WITH BASKET: SHX5317

## 2014-04-11 HISTORY — DX: Frequency of micturition: R35.0

## 2014-04-11 HISTORY — DX: Calculus of ureter: N20.1

## 2014-04-11 HISTORY — DX: Urgency of urination: R39.15

## 2014-04-11 HISTORY — PX: CYSTOSCOPY W/ URETERAL STENT REMOVAL: SHX1430

## 2014-04-11 HISTORY — DX: Personal history of other infectious and parasitic diseases: Z86.19

## 2014-04-11 HISTORY — DX: Hematuria, unspecified: R31.9

## 2014-04-11 HISTORY — DX: Other complications of anesthesia, initial encounter: T88.59XA

## 2014-04-11 SURGERY — CYSTOSCOPY, WITH CALCULUS REMOVAL USING BASKET
Anesthesia: General | Site: Ureter | Laterality: Left

## 2014-04-11 MED ORDER — ACETAMINOPHEN 325 MG PO TABS
650.0000 mg | ORAL_TABLET | ORAL | Status: DC | PRN
  Filled 2014-04-11: qty 2

## 2014-04-11 MED ORDER — CIPROFLOXACIN IN D5W 400 MG/200ML IV SOLN
400.0000 mg | INTRAVENOUS | Status: AC
  Administered 2014-04-11: 400 mg via INTRAVENOUS
  Filled 2014-04-11: qty 200

## 2014-04-11 MED ORDER — LIDOCAINE HCL (CARDIAC) 20 MG/ML IV SOLN
INTRAVENOUS | Status: DC | PRN
  Administered 2014-04-11: 60 mg via INTRAVENOUS

## 2014-04-11 MED ORDER — CIPROFLOXACIN IN D5W 400 MG/200ML IV SOLN
INTRAVENOUS | Status: AC
  Filled 2014-04-11: qty 200

## 2014-04-11 MED ORDER — MIDAZOLAM HCL 5 MG/5ML IJ SOLN
INTRAMUSCULAR | Status: DC | PRN
  Administered 2014-04-11: 2 mg via INTRAVENOUS

## 2014-04-11 MED ORDER — ACETAMINOPHEN 650 MG RE SUPP
650.0000 mg | RECTAL | Status: DC | PRN
  Filled 2014-04-11: qty 1

## 2014-04-11 MED ORDER — KETOROLAC TROMETHAMINE 30 MG/ML IJ SOLN
INTRAMUSCULAR | Status: DC | PRN
  Administered 2014-04-11: 30 mg via INTRAVENOUS

## 2014-04-11 MED ORDER — FENTANYL CITRATE 0.05 MG/ML IJ SOLN
25.0000 ug | INTRAMUSCULAR | Status: DC | PRN
  Filled 2014-04-11: qty 1

## 2014-04-11 MED ORDER — OXYCODONE HCL 5 MG PO TABS
5.0000 mg | ORAL_TABLET | ORAL | Status: DC | PRN
  Filled 2014-04-11: qty 2

## 2014-04-11 MED ORDER — PHENAZOPYRIDINE HCL 100 MG PO TABS
ORAL_TABLET | ORAL | Status: AC
  Filled 2014-04-11: qty 2

## 2014-04-11 MED ORDER — FENTANYL CITRATE 0.05 MG/ML IJ SOLN
100.0000 ug | Freq: Once | INTRAMUSCULAR | Status: AC
  Administered 2014-04-11: 100 ug via INTRAVENOUS
  Filled 2014-04-11: qty 2

## 2014-04-11 MED ORDER — SODIUM CHLORIDE 0.9 % IJ SOLN
3.0000 mL | INTRAMUSCULAR | Status: DC | PRN
  Filled 2014-04-11: qty 3

## 2014-04-11 MED ORDER — SODIUM CHLORIDE 0.9 % IV SOLN
250.0000 mL | INTRAVENOUS | Status: DC | PRN
  Filled 2014-04-11: qty 250

## 2014-04-11 MED ORDER — SODIUM CHLORIDE 0.9 % IJ SOLN
3.0000 mL | Freq: Two times a day (BID) | INTRAMUSCULAR | Status: DC
  Filled 2014-04-11: qty 3

## 2014-04-11 MED ORDER — FENTANYL CITRATE 0.05 MG/ML IJ SOLN
INTRAMUSCULAR | Status: AC
  Filled 2014-04-11: qty 4

## 2014-04-11 MED ORDER — PHENAZOPYRIDINE HCL 200 MG PO TABS
200.0000 mg | ORAL_TABLET | Freq: Once | ORAL | Status: AC
  Administered 2014-04-11: 200 mg via ORAL
  Filled 2014-04-11: qty 1

## 2014-04-11 MED ORDER — ACETAMINOPHEN 10 MG/ML IV SOLN
INTRAVENOUS | Status: DC | PRN
  Administered 2014-04-11: 1000 mg via INTRAVENOUS

## 2014-04-11 MED ORDER — PROPOFOL INFUSION 10 MG/ML OPTIME
INTRAVENOUS | Status: DC | PRN
  Administered 2014-04-11: 160 mL via INTRAVENOUS

## 2014-04-11 MED ORDER — ONDANSETRON HCL 4 MG/2ML IJ SOLN
INTRAMUSCULAR | Status: DC | PRN
  Administered 2014-04-11: 4 mg via INTRAVENOUS

## 2014-04-11 MED ORDER — PROMETHAZINE HCL 25 MG/ML IJ SOLN
6.2500 mg | INTRAMUSCULAR | Status: DC | PRN
  Filled 2014-04-11: qty 1

## 2014-04-11 MED ORDER — DEXAMETHASONE SODIUM PHOSPHATE 10 MG/ML IJ SOLN
INTRAMUSCULAR | Status: DC | PRN
  Administered 2014-04-11: 10 mg via INTRAVENOUS

## 2014-04-11 MED ORDER — FENTANYL CITRATE 0.05 MG/ML IJ SOLN
INTRAMUSCULAR | Status: AC
  Filled 2014-04-11: qty 2

## 2014-04-11 MED ORDER — LACTATED RINGERS IV SOLN
INTRAVENOUS | Status: DC
  Administered 2014-04-11: 11:00:00 via INTRAVENOUS
  Filled 2014-04-11: qty 1000

## 2014-04-11 MED ORDER — MEPERIDINE HCL 25 MG/ML IJ SOLN
6.2500 mg | INTRAMUSCULAR | Status: DC | PRN
  Filled 2014-04-11: qty 1

## 2014-04-11 MED ORDER — MIDAZOLAM HCL 2 MG/2ML IJ SOLN
INTRAMUSCULAR | Status: AC
  Filled 2014-04-11: qty 2

## 2014-04-11 MED ORDER — LACTATED RINGERS IV SOLN
INTRAVENOUS | Status: DC
  Filled 2014-04-11: qty 1000

## 2014-04-11 MED ORDER — SODIUM CHLORIDE 0.9 % IR SOLN
Status: DC | PRN
  Administered 2014-04-11: 6000 mL

## 2014-04-11 SURGICAL SUPPLY — 33 items
BAG DRAIN URO-CYSTO SKYTR STRL (DRAIN) ×3 IMPLANT
BASKET LASER NITINOL 1.9FR (BASKET) IMPLANT
BASKET ZERO TIP NITINOL 2.4FR (BASKET) ×3 IMPLANT
CANISTER SUCT LVC 12 LTR MEDI- (MISCELLANEOUS) ×3 IMPLANT
CATH URET 5FR 28IN CONE TIP (BALLOONS)
CATH URET 5FR 28IN OPEN ENDED (CATHETERS) IMPLANT
CATH URET 5FR 70CM CONE TIP (BALLOONS) IMPLANT
CLOTH BEACON ORANGE TIMEOUT ST (SAFETY) ×3 IMPLANT
DRAPE CAMERA CLOSED 9X96 (DRAPES) ×3 IMPLANT
ELECT REM PT RETURN 9FT ADLT (ELECTROSURGICAL)
ELECTRODE REM PT RTRN 9FT ADLT (ELECTROSURGICAL) IMPLANT
FIBER LASER FLEXIVA 1000 (UROLOGICAL SUPPLIES) IMPLANT
FIBER LASER FLEXIVA 200 (UROLOGICAL SUPPLIES) IMPLANT
FIBER LASER FLEXIVA 365 (UROLOGICAL SUPPLIES) IMPLANT
FIBER LASER FLEXIVA 550 (UROLOGICAL SUPPLIES) IMPLANT
GLOVE BIO SURGEON STRL SZ 6.5 (GLOVE) ×3 IMPLANT
GLOVE BIOGEL PI IND STRL 6.5 (GLOVE) ×4 IMPLANT
GLOVE BIOGEL PI INDICATOR 6.5 (GLOVE) ×2
GLOVE SURG SS PI 8.0 STRL IVOR (GLOVE) ×3 IMPLANT
GOWN PREVENTION PLUS LG XLONG (DISPOSABLE) IMPLANT
GOWN STRL REIN XL XLG (GOWN DISPOSABLE) IMPLANT
GOWN STRL REUS W/TWL LRG LVL3 (GOWN DISPOSABLE) ×3 IMPLANT
GOWN STRL REUS W/TWL XL LVL3 (GOWN DISPOSABLE) ×3 IMPLANT
GUIDEWIRE 0.038 PTFE COATED (WIRE) IMPLANT
GUIDEWIRE ANG ZIPWIRE 038X150 (WIRE) IMPLANT
GUIDEWIRE STR DUAL SENSOR (WIRE) ×3 IMPLANT
IV NS IRRIG 3000ML ARTHROMATIC (IV SOLUTION) ×6 IMPLANT
KIT BALLIN UROMAX 15FX10 (LABEL) IMPLANT
KIT BALLN UROMAX 15FX4 (MISCELLANEOUS) IMPLANT
KIT BALLN UROMAX 26 75X4 (MISCELLANEOUS)
PACK CYSTO (CUSTOM PROCEDURE TRAY) ×3 IMPLANT
SET HIGH PRES BAL DIL (LABEL)
SHEATH ACCESS URETERAL 38CM (SHEATH) IMPLANT

## 2014-04-11 NOTE — Brief Op Note (Signed)
04/11/2014  12:34 PM  PATIENT:  Claire Bradley  44 y.o. female  PRE-OPERATIVE DIAGNOSIS:  LEFT URETERAL STONE  POST-OPERATIVE DIAGNOSIS:  LEFT URETERAL STONE  PROCEDURE:  Procedure(s): LEFT URETEROSCOPY STONE EXTRACTION WITH BASKET (Left)  SURGEON:  Surgeon(s) and Role:    * Anner CreteJohn J Jeris Easterly, MD - Primary  PHYSICIAN ASSISTANT:   ASSISTANTS: none   ANESTHESIA:   general  EBL:     BLOOD ADMINISTERED:none  DRAINS: none   LOCAL MEDICATIONS USED:  NONE  SPECIMEN:  Source of Specimen:  left proximal stone  DISPOSITION OF SPECIMEN:  to patient to bring to office  COUNTS:  YES  TOURNIQUET:  * No tourniquets in log *  DICTATION: .Other Dictation: Dictation Number [id number]  PLAN OF CARE: Discharge to home after PACU  PATIENT DISPOSITION:  PACU - hemodynamically stable.   Delay start of Pharmacological VTE agent (>24hrs) due to surgical blood loss or risk of bleeding: not applicable

## 2014-04-11 NOTE — Anesthesia Procedure Notes (Signed)
Procedure Name: LMA Insertion Date/Time: 04/11/2014 12:15 PM Performed by: Maris BergerENENNY, Ikey Omary T Pre-anesthesia Checklist: Patient identified, Emergency Drugs available, Suction available and Patient being monitored Patient Re-evaluated:Patient Re-evaluated prior to inductionOxygen Delivery Method: Circle System Utilized Preoxygenation: Pre-oxygenation with 100% oxygen Intubation Type: IV induction Ventilation: Mask ventilation without difficulty LMA: LMA inserted LMA Size: 4.0 Number of attempts: 1 Airway Equipment and Method: bite block Placement Confirmation: positive ETCO2 Tube secured with: Tape Dental Injury: Teeth and Oropharynx as per pre-operative assessment

## 2014-04-11 NOTE — Anesthesia Preprocedure Evaluation (Signed)
Anesthesia Evaluation  Patient identified by MRN, date of birth, ID band Patient awake    Reviewed: Allergy & Precautions, NPO status , Patient's Chart, lab work & pertinent test results  History of Anesthesia Complications (+) PROLONGED EMERGENCE  Airway Mallampati: II  TM Distance: >3 FB Neck ROM: Full    Dental no notable dental hx. (+) Poor Dentition   Pulmonary neg pulmonary ROS, Current Smoker,  breath sounds clear to auscultation  Pulmonary exam normal       Cardiovascular negative cardio ROS  Rhythm:Regular Rate:Normal     Neuro/Psych negative neurological ROS  negative psych ROS   GI/Hepatic negative GI ROS, Neg liver ROS,   Endo/Other  negative endocrine ROS  Renal/GU negative Renal ROS  negative genitourinary   Musculoskeletal negative musculoskeletal ROS (+)   Abdominal   Peds negative pediatric ROS (+)  Hematology negative hematology ROS (+)   Anesthesia Other Findings   Reproductive/Obstetrics negative OB ROS                             Anesthesia Physical Anesthesia Plan  ASA: II  Anesthesia Plan: General   Post-op Pain Management:    Induction: Intravenous  Airway Management Planned: LMA  Additional Equipment:   Intra-op Plan:   Post-operative Plan: Extubation in OR  Informed Consent: I have reviewed the patients History and Physical, chart, labs and discussed the procedure including the risks, benefits and alternatives for the proposed anesthesia with the patient or authorized representative who has indicated his/her understanding and acceptance.   Dental advisory given  Plan Discussed with: CRNA  Anesthesia Plan Comments:         Anesthesia Quick Evaluation

## 2014-04-11 NOTE — H&P (View-Only) (Signed)
Subjective: I was called in consultation by Dr. Manus Gunningancour to see Claire Bradley for a 5mm left ureteral stone with obstruction and possible UTI.   She has pyuria and a WBC on 15.2 with tachycardia but no fever.    She had the onset this am of pain that has been severe.  She also reports a fever.   She has no voiding complaints or hematuria.   She has nausea with vomiting.  ROS:  Review of Systems  Constitutional: Positive for fever.  Gastrointestinal: Positive for nausea and abdominal pain.  Genitourinary: Positive for flank pain. Negative for dysuria, urgency and frequency.  All other systems reviewed and are negative.  Allergies  Allergen Reactions  . Penicillins Nausea And Vomiting    Past Medical History  Diagnosis Date  . Cholelithiasis   . Kidney stones     Past Surgical History  Procedure Laterality Date  . Cesarean section    . Shoulder surgery    . Tubal ligation    . Cholecystectomy N/A 08/18/2012    Procedure: LAPAROSCOPIC CHOLECYSTECTOMY;  Surgeon: Fabio BeringBrent C Ziegler, MD;  Location: [facility];  Service: General;  Laterality: N/A;  . Abdominal hysterectomy      History   Social History  . Marital Status: Single    Spouse Name: N/A    Number of Children: N/A  . Years of Education: N/A   Occupational History  . Not on file.   Social History Main Topics  . Smoking status: Current Every Day Smoker -- 0.25 packs/day    Types: Cigarettes  . Smokeless tobacco: Never Used  . Alcohol Use: No  . Drug Use: No  . Sexual Activity: Yes    Birth Control/ Protection: Surgical   Other Topics Concern  . Not on file   Social History Narrative    No family history on file.  Anti-infectives: Anti-infectives    Start     Dose/Rate Route Frequency Ordered Stop   03/22/14 1300  cefTRIAXone (ROCEPHIN) 1 g in dextrose 5 % 50 mL IVPB     1 g100 mL/hr over 30 Minutes Intravenous  Once 03/22/14 1255        Current Facility-Administered Medications  Medication Dose Route  Frequency Provider Last Rate Last Dose  . cefTRIAXone (ROCEPHIN) 1 g in dextrose 5 % 50 mL IVPB  1 g Intravenous Once Glynn OctaveStephen Rancour, MD 100 mL/hr at 03/22/14 1327 1 g at 03/22/14 1327  . ondansetron (ZOFRAN) 4 MG/5ML solution           . ondansetron (ZOFRAN) injection 4 mg  4 mg Intravenous Once Glynn OctaveStephen Rancour, MD   4 mg at 03/22/14 1201   Current Outpatient Prescriptions  Medication Sig Dispense Refill  . azithromycin (ZITHROMAX Z-PAK) 250 MG tablet 2 po day one, then 1 daily x 4 days (Patient not taking: Reported on 03/22/2014) 5 tablet 0     Objective: Vital signs in last 24 hours: Temp:  [98.4 F (36.9 C)] 98.4 F (36.9 C) (12/18 1110) Pulse Rate:  [91-103] 91 (12/18 1346) Resp:  [19-21] 19 (12/18 1346) BP: (103-110)/(47-63) 106/63 mmHg (12/18 1346) SpO2:  [96 %] 96 % (12/18 1346) Weight:  [81.647 kg (180 lb)] 81.647 kg (180 lb) (12/18 1110)  Intake/Output from previous day:   Intake/Output this shift:     Physical Exam  Constitutional: She is oriented to person, place, and time and well-developed, well-nourished, and in no distress.  HENT:  Head: Normocephalic and atraumatic.  Neck:  Normal range of motion. Neck supple.  Cardiovascular: Regular rhythm.   No murmur heard. tachycardic  Pulmonary/Chest: Effort normal and breath sounds normal. No respiratory distress.  Abdominal: Soft. There is tenderness (left flank and left lower quadrant).  Musculoskeletal: Normal range of motion. She exhibits no edema.  Neurological: She is alert and oriented to person, place, and time.  Skin: Skin is warm and dry. No erythema.  Psychiatric: Mood and affect normal.  Vitals reviewed.   Lab Results:   Recent Labs  03/22/14 1137  WBC 15.2*  HGB 12.2  HCT 36.5  PLT 288   BMET  Recent Labs  03/22/14 1137  NA 141  K 3.3*  CL 104  CO2 24  GLUCOSE 102*  BUN 9  CREATININE 1.06  CALCIUM 9.4   PT/INR No results for input(s): LABPROT, INR in the last 72 hours. ABG No  results for input(s): PHART, HCO3 in the last 72 hours.  Invalid input(s): PCO2, PO2  Studies/Results: Ct Head Wo Contrast  03/22/2014   CLINICAL DATA:  Headache and fever  EXAM: CT HEAD WITHOUT CONTRAST  TECHNIQUE: Contiguous axial images were obtained from the base of the skull through the vertex without intravenous contrast.  COMPARISON:  None.  FINDINGS: The ventricles are normal in size and configuration. There is no mass, hemorrhage, extra-axial fluid collection, or midline shift. Gray-white compartments are normal. No acute infarct apparent. Bony calvarium appears intact. The mastoid air cells are clear. There is debris in each external auditory canal.  IMPRESSION: Suspect cerumen in each external auditory canal. No intracranial mass, hemorrhage, or focal gray -white compartment lesions/acute appearing infarct.   Electronically Signed   By: Bretta BangWilliam  Woodruff M.D.   On: 03/22/2014 12:28   Ct Renal Stone Study  03/22/2014   CLINICAL DATA:  Left flank pain since 3 a.m. today.  Vomiting.  EXAM: CT ABDOMEN AND PELVIS WITHOUT CONTRAST  TECHNIQUE: Multidetector CT imaging of the abdomen and pelvis was performed following the standard protocol without IV contrast.  COMPARISON:  07/28/2012  FINDINGS: There is bibasilar dependent atelectasis. No effusions. Heart is normal size.  Moderate left hydronephrosis. Extensive left perinephric stranding. 5 mm proximal left ureteral stone. No additional ureteral stones. No renal stones. No hydronephrosis on the right.  Prior cholecystectomy. Liver, spleen, pancreas, adrenals have an unremarkable unenhanced appearance. Appendix is visualized and is normal. Stomach, large and small bowel are unremarkable. No free fluid, free air or adenopathy. Aorta is normal caliber.  No acute bony abnormality or focal bone lesion.  IMPRESSION: 5 mm proximal left ureteral stone with moderate hydronephrosis and extensive perinephric stranding.   Electronically Signed   By: Charlett NoseKevin  Dover  M.D.   On: 03/22/2014 13:28     Assessment: She has a 5mm obstructing left proximal stone with probable UTI and only partial relief with medication.   Plan: She will be admitted to the hospitalist service and I will take her to the OR for cystoscopy and left ureteral stenting.   She will need ureteroscopy at a later date.  I reviewed the risks of bleeding, infection, ureteral injury, need for secondary procedures, thrombotic events and anesthetic complications.   CC: Dr. Manus Gunningancour.     LOS: 0 days    Kevork Joyce J 03/22/2014

## 2014-04-11 NOTE — Discharge Instructions (Addendum)
Cystoscopy Cystoscopy is a procedure that is used to help your caregiver diagnose and sometimes treat conditions that affect your lower urinary tract. Your lower urinary tract includes your bladder and the tube through which urine passes from your bladder out of your body (urethra). Cystoscopy is performed with a thin, tube-shaped instrument (cystoscope). The cystoscope has lenses and a light at the end so that your caregiver can see inside your bladder. The cystoscope is inserted at the entrance of your urethra. Your caregiver guides it through your urethra and into your bladder. There are two main types of cystoscopy:  Flexible cystoscopy (with a flexible cystoscope).  Rigid cystoscopy (with a rigid cystoscope). Cystoscopy may be recommended for many conditions,  :CYSTOSCOPY HOME CARE INSTRUCTIONS  Activity: Rest for the remainder of the day.  Do not drive or operate equipment today.  You may resume normal activities in one to two days as instructed by your physician.   Meals: Drink plenty of liquids and eat light foods such as gelatin or soup this evening.  You may return to a normal meal plan tomorrow.  Return to Work: You may return to work in one to two days or as instructed by your physician.  Special Instructions / Symptoms: Call your physician if any of these symptoms occur:   -persistent or heavy bleeding  -bleeding which continues after first few urination  -large blood clots that are difficult to pass  -urine stream diminishes or stops completely  -fever equal to or higher than 101 degrees Farenheit.  -cloudy urine with a strong, foul odor  -severe pain  Females should always wipe from front to back after elimination.  You may feel some burning pain when you urinate.  This should disappear with time.  Applying moist heat to the lower abdomen or a hot tub bath may help relieve the pain. \  Patient Signature:   ________________________________________________________  Nurse's Signature:  ________________________________________________________   Urinary tract infections.  Blood in your urine (hematuria).  Loss of bladder control (urinary incontinence) or overactive bladder.  Unusual cells found in a urine sample.  Urinary blockage.  Painful urination. Cystoscopy may also be done to remove a sample of your tissue to be checked under a microscope (biopsy). It may also be done to remove or destroy bladder stones. LET YOUR CAREGIVER KNOW ABOUT:  Allergies to food or medicine.  Medicines taken, including vitamins, herbs, eyedrops, over-the-counter medicines, and creams.  Use of steroids (by mouth or creams).  Previous problems with anesthetics or numbing medicines.  History of bleeding problems or blood clots.  Previous surgery.  Other health problems, including diabetes and kidney problems.  Possibility of pregnancy, if this applies. PROCEDURE The area around the opening to your urethra will be cleaned. A medicine to numb your urethra (local anesthetic) is used. If a tissue sample or stone is removed during the procedure, you may be given a medicine to make you sleep (general anesthetic). Your caregiver will gently insert the tip of the cystoscope into your urethra. The cystoscope will be slowly glided through your urethra and into your bladder. Sterile fluid will flow through the cystoscope and into your bladder. The fluid will expand and stretch your bladder. This gives your caregiver a better view of your bladder walls. The procedure lasts about 15-20 minutes. AFTER THE PROCEDURE If a local anesthetic is used, you will be allowed to go home as soon as you are ready. If a general anesthetic is used, you will be taken to  a recovery area until you are stable. You may have temporary bleeding and burning on urination. Document Released: 03/19/2000 Document Revised: 12/15/2011 Document  Reviewed: 09/13/2011 [facility] Patient Information 2015 [place], [place]. This information is not intended to replace advice given to you by your health care provider. Make sure you discuss any questions you have with your health care provider.   Post Anesthesia Home Care Instructions  Activity: Get plenty of rest for the remainder of the day. A responsible adult should stay with you for 24 hours following the procedure.  For the next 24 hours, DO NOT: -Drive a car -Advertising copywriter -Drink alcoholic beverages -Take any medication unless instructed by your physician -Make any legal decisions or sign important papers.  Meals: Start with liquid foods such as gelatin or soup. Progress to regular foods as tolerated. Avoid greasy, spicy, heavy foods. If nausea and/or vomiting occur, drink only clear liquids until the nausea and/or vomiting subsides. Call your physician if vomiting continues.  Special Instructions/Symptoms: Your throat may feel dry or sore from the anesthesia or the breathing tube placed in your throat during surgery. If this causes discomfort, gargle with warm salt water. The discomfort should disappear within 24 hours.

## 2014-04-11 NOTE — Anesthesia Postprocedure Evaluation (Signed)
  Anesthesia Post-op Note  Patient: Claire Bradley  Procedure(s) Performed: Procedure(s) (LRB): LEFT URETEROSCOPY STONE EXTRACTION WITH BASKET (Left) CYSTOSCOPY WITH STENT REMOVAL (Left)  Patient Location: PACU  Anesthesia Type: General  Level of Consciousness: awake and alert   Airway and Oxygen Therapy: Patient Spontanous Breathing  Post-op Pain: mild  Post-op Assessment: Post-op Vital signs reviewed, Patient's Cardiovascular Status Stable, Respiratory Function Stable, Patent Airway and No signs of Nausea or vomiting  Last Vitals:  Filed Vitals:   04/11/14 1330  BP: 110/61  Pulse: 84  Temp:   Resp: 13    Post-op Vital Signs: stable   Complications: No apparent anesthesia complications

## 2014-04-11 NOTE — Interval H&P Note (Signed)
History and Physical Interval Note:  Her urine culture from earlier this week is clear despite not taking antibiotics post discharge.   04/11/2014 10:43 AM  Claire Bradley  has presented today for surgery, with the diagnosis of LEFT URETERAL STONE  The various methods of treatment have been discussed with the patient and family. After consideration of risks, benefits and other options for treatment, the patient has consented to  Procedure(s): LEFT URETEROSCOPY STONE EXTRACTION  (Left) HOLMIUM LASER APPLICATION (Left) as a surgical intervention .  The patient's history has been reviewed, patient examined, no change in status, stable for surgery.  I have reviewed the patient's chart and labs.  Questions were answered to the patient's satisfaction.     [name]

## 2014-04-11 NOTE — Transfer of Care (Signed)
Immediate Anesthesia Transfer of Care Note  Patient: Claire Bradley  Procedure(s) Performed: Procedure(s): LEFT URETEROSCOPY STONE EXTRACTION WITH BASKET (Left) CYSTOSCOPY WITH STENT REMOVAL (Left)  Patient Location: PACU  Anesthesia Type:General  Level of Consciousness: awake and oriented  Airway & Oxygen Therapy: Patient Spontanous Breathing and Patient connected to nasal cannula oxygen  Post-op Assessment: Report given to PACU RN  Post vital signs: Reviewed and stable  Complications: No apparent anesthesia complications

## 2014-04-11 NOTE — OR Nursing (Signed)
Left stone taking by dr. Annabell HowellsWrenn.

## 2014-04-12 ENCOUNTER — Encounter (HOSPITAL_BASED_OUTPATIENT_CLINIC_OR_DEPARTMENT_OTHER): Payer: Self-pay | Admitting: Urology

## 2014-04-12 NOTE — Op Note (Signed)
NAME:  Claire Bradley, Claire Bradley              ACCOUNT NO.:  [id number]  MEDICAL RECORD NO.:  [id number]  LOCATION:                                FACILITY:  [facility]  PHYSICIAN:  Excell SeltzerJohn J. Annabell HowellsWrenn, M.D.    DATE OF BIRTH:  [birth year]  DATE OF PROCEDURE:  04/11/2014 DATE OF DISCHARGE:  04/11/2014                              OPERATIVE REPORT   PROCEDURE:  Cystoscopy with removal of left double-J stent and left ureteroscopic stone extraction.  PREOPERATIVE DIAGNOSIS:  Left proximal ureteral stone.  POSTOPERATIVE DIAGNOSIS:  Left proximal ureteral stone.  SURGEON:  Excell SeltzerJohn J. Annabell HowellsWrenn, M.D.  ANESTHESIA:  General.  SPECIMEN:  Stone.  DRAINS:  None.  ESTIMATED BLOOD LOSS:  None.  COMPLICATIONS:  None.  INDICATIONS:  [name] is a 44 year old white female who was admitted to the hospital a couple of weeks ago with sepsis from an obstructing left proximal ureteral stone and urinary tract infection.  She underwent stenting and returns today for stone removal.  FINDINGS AND PROCEDURE:  She was given Cipro which she had been on prior to the procedure.  A culture 2 days ago was negative.  She was given a general anesthetic and placed in lithotomy position and fitted with PAS hose.  Her perineum and genitalia were prepped with Betadine solution and she was draped in usual sterile fashion.  Cystoscopy was performed using a 22-French scope and 12-degree lens. Examination revealed the stent exiting the left ureteral orifice which was somewhat edematous.  The stent was removed.  A 6.5-French short ureteroscope was then inserted per urethra and with the aid of a guidewire, the left ureteral meatus was negotiated.  I was able to advance the scope to the stone in the proximal ureter.  This stone was engaged with a Nitinol basket and because of the ureteral dilation from the stent, I was able to easily remove the stone intact.  Reinspection of the ureter after stone removal revealed no significant ureteral  trauma.  There were some small clots in the ureter, but it was not felt that a stent was indicated.  At this point, the patient's bladder was drained.  She was taken down from lithotomy position.  Her anesthetic was reversed.  She was moved to recovery room in stable condition.  There were no complications.     Excell SeltzerJohn J. Annabell HowellsWrenn, M.D.     JJW/MEDQ  D:  04/11/2014  T:  04/12/2014  Job:  [id number]

## 2014-08-29 ENCOUNTER — Emergency Department (HOSPITAL_COMMUNITY): Payer: Self-pay

## 2014-08-29 ENCOUNTER — Encounter (HOSPITAL_COMMUNITY): Payer: Self-pay

## 2014-08-29 ENCOUNTER — Emergency Department (HOSPITAL_COMMUNITY)
Admission: EM | Admit: 2014-08-29 | Discharge: 2014-08-29 | Disposition: A | Discharge status: Home / Self Care | Payer: Self-pay | Attending: Emergency Medicine | Admitting: Emergency Medicine

## 2014-08-29 DIAGNOSIS — Z3202 Encounter for pregnancy test, result negative: Secondary | ICD-10-CM | POA: Insufficient documentation

## 2014-08-29 DIAGNOSIS — M545 Low back pain, unspecified: Secondary | ICD-10-CM

## 2014-08-29 DIAGNOSIS — Z79899 Other long term (current) drug therapy: Secondary | ICD-10-CM | POA: Insufficient documentation

## 2014-08-29 DIAGNOSIS — Z8619 Personal history of other infectious and parasitic diseases: Secondary | ICD-10-CM | POA: Insufficient documentation

## 2014-08-29 DIAGNOSIS — Z880 Allergy status to penicillin: Secondary | ICD-10-CM | POA: Insufficient documentation

## 2014-08-29 DIAGNOSIS — Z8742 Personal history of other diseases of the female genital tract: Secondary | ICD-10-CM | POA: Insufficient documentation

## 2014-08-29 DIAGNOSIS — Z720 Tobacco use: Secondary | ICD-10-CM | POA: Insufficient documentation

## 2014-08-29 DIAGNOSIS — Z792 Long term (current) use of antibiotics: Secondary | ICD-10-CM | POA: Insufficient documentation

## 2014-08-29 DIAGNOSIS — Z87448 Personal history of other diseases of urinary system: Secondary | ICD-10-CM | POA: Insufficient documentation

## 2014-08-29 LAB — URINALYSIS, ROUTINE W REFLEX MICROSCOPIC
BILIRUBIN URINE: NEGATIVE
GLUCOSE, UA: NEGATIVE mg/dL
KETONES UR: NEGATIVE mg/dL
Leukocytes, UA: NEGATIVE
NITRITE: NEGATIVE
Protein, ur: NEGATIVE mg/dL
Specific Gravity, Urine: >1.03 — ABNORMAL HIGH (ref 1.005–1.030)
Urobilinogen, UA: 0.2 mg/dL (ref 0.0–1.0)
pH: 5.5 (ref 5.0–8.0)

## 2014-08-29 LAB — CBC WITH DIFFERENTIAL/PLATELET
Basophils Absolute: 0 10*3/uL (ref 0.0–0.1)
Basophils Relative: 0 % (ref 0–1)
EOS PCT: 3 % (ref 0–5)
Eosinophils Absolute: 0.4 10*3/uL (ref 0.0–0.7)
HCT: 40.1 % (ref 36.0–46.0)
Hemoglobin: 13.1 g/dL (ref 12.0–15.0)
LYMPHS ABS: 3.6 10*3/uL (ref 0.7–4.0)
LYMPHS PCT: 33 % (ref 12–46)
MCH: 31.4 pg (ref 26.0–34.0)
MCHC: 32.7 g/dL (ref 30.0–36.0)
MCV: 96.2 fL (ref 78.0–100.0)
Monocytes Absolute: 0.9 10*3/uL (ref 0.1–1.0)
Monocytes Relative: 8 % (ref 3–12)
Neutro Abs: 6.1 10*3/uL (ref 1.7–7.7)
Neutrophils Relative %: 56 % (ref 43–77)
PLATELETS: 280 10*3/uL (ref 150–400)
RBC: 4.17 MIL/uL (ref 3.87–5.11)
RDW: 12.5 % (ref 11.5–15.5)
WBC: 10.9 10*3/uL — ABNORMAL HIGH (ref 4.0–10.5)

## 2014-08-29 LAB — PREGNANCY, URINE: PREG TEST UR: NEGATIVE

## 2014-08-29 LAB — BASIC METABOLIC PANEL
Anion gap: 9 (ref 5–15)
BUN: 13 mg/dL (ref 6–20)
CHLORIDE: 102 mmol/L (ref 101–111)
CO2: 25 mmol/L (ref 22–32)
Calcium: 9.4 mg/dL (ref 8.9–10.3)
Creatinine, Ser: 0.72 mg/dL (ref 0.44–1.00)
GFR calc Af Amer: >60 mL/min (ref >60)
Glucose, Bld: 92 mg/dL (ref 65–99)
Potassium: 4.1 mmol/L (ref 3.5–5.1)
Sodium: 136 mmol/L (ref 135–145)

## 2014-08-29 LAB — CBG MONITORING, ED: Glucose-Capillary: 131 mg/dL — ABNORMAL HIGH (ref 65–99)

## 2014-08-29 LAB — URINE MICROSCOPIC-ADD ON

## 2014-08-29 MED ORDER — NAPROXEN 500 MG PO TABS
500.0000 mg | ORAL_TABLET | Freq: Two times a day (BID) | ORAL | Status: DC
Start: 2014-08-29 — End: 2014-12-09

## 2014-08-29 MED ORDER — KETOROLAC TROMETHAMINE 30 MG/ML IJ SOLN
60.0000 mg | Freq: Once | INTRAMUSCULAR | Status: AC
  Administered 2014-08-29: 60 mg via INTRAMUSCULAR
  Filled 2014-08-29: qty 2

## 2014-08-29 MED ORDER — CYCLOBENZAPRINE HCL 10 MG PO TABS: 10.0000 mg | ORAL_TABLET | Freq: Two times a day (BID) | ORAL | Status: DC | PRN

## 2014-08-29 NOTE — ED Notes (Signed)
Pt c/o bilateral flank pain.  Reports nausea no vomiting.  Has history of kidney stones.

## 2014-08-29 NOTE — ED Provider Notes (Signed)
CSN: [id number]     Arrival date & time 08/29/14  1409 History   First MD Initiated Contact with Patient 08/29/14 1537     Chief Complaint  Patient presents with  . Flank Pain     (Consider location/radiation/quality/duration/timing/severity/associated sxs/prior Treatment) HPI... Vague bilateral lower back pain for 4 days worse with walking. She has been eating and drinking normally. Normal urination. Normal bowel movements. On 03/22/2014 she had a left ureteral stone with pyelonephritis which resulted in a cystoscopy with ureteral stent placement. No flank pain, dysuria, hematuria. Questionable fever. Severity is mild to moderate. Walking makes it worse.  Past Medical History  Diagnosis Date  . Left ureteral calculus   . History of sepsis     UROSEPSIS / PYELONEPHRITIS--   03-25-2014  . History of PID   . Complication of anesthesia     HARD TO WAKE  . Urgency of urination   . Frequency of urination   . Hematuria    Past Surgical History  Procedure Laterality Date  . Cesarean section  03-11-2003  . Cholecystectomy N/A 08/18/2012    Procedure: LAPAROSCOPIC CHOLECYSTECTOMY;  Surgeon: Fabio Bering, MD;  Location: [facility];  Service: General;  Laterality: N/A;  . Cystoscopy with stent placement Left 03/22/2014    Procedure: CYSTOSCOPY WITH STENT PLACEMENT;  Surgeon: Anner Crete, MD;  Location: [facility];  Service: Urology;  Laterality: Left;  . Abdominal hysterectomy  02/  2015  . Tubal ligation  2006 (approx)  . Shoulder arthroscopy with rotator cuff repair Left 2000  . Cystoscopy/retrograde/ureteroscopy/stone extraction with basket Left 04/11/2014    Procedure: LEFT URETEROSCOPY STONE EXTRACTION WITH BASKET;  Surgeon: Anner Crete, MD;  Location: [facility];  Service: Urology;  Laterality: Left;  . Cystoscopy w/ ureteral stent removal Left 04/11/2014    Procedure: CYSTOSCOPY WITH STENT REMOVAL;  Surgeon: Anner Crete, MD;  Location: [facility];  Service:  Urology;  Laterality: Left;   No family history on file. History  Substance Use Topics  . Smoking status: Current Every Day Smoker -- 0.50 packs/day for 25 years    Types: Cigarettes  . Smokeless tobacco: Never Used  . Alcohol Use: No   OB History    No data available     Review of Systems  All other systems reviewed and are negative.     Allergies  Penicillins  Home Medications   Prior to Admission medications   Medication Sig Start Date End Date Taking? Authorizing Provider  ciprofloxacin (CIPRO) 500 MG tablet Take 1 tablet (500 mg total) by mouth 2 (two) times daily. Patient not taking: Reported on 08/29/2014 03/25/14   Gwenyth Bender, NP  cyclobenzaprine (FLEXERIL) 10 MG tablet Take 1 tablet (10 mg total) by mouth 2 (two) times daily as needed for muscle spasms. 08/29/14   Donnetta Hutching, MD  naproxen (NAPROSYN) 500 MG tablet Take 1 tablet (500 mg total) by mouth 2 (two) times daily. 08/29/14   Donnetta Hutching, MD  oxyCODONE-acetaminophen (ROXICET) 5-325 MG per tablet Take 1 tablet by mouth every 4 (four) hours as needed for severe pain. Patient not taking: Reported on 08/29/2014 03/22/14   Bjorn Pippin, MD  phenazopyridine (PYRIDIUM) 200 MG tablet Take 1 tablet (200 mg total) by mouth 3 (three) times daily as needed for pain. Patient not taking: Reported on 08/29/2014 03/22/14   Bjorn Pippin, MD   BP 124/87 mmHg  Pulse 74  Temp(Src) 99 F (37.2 C) (Oral)  Resp  18  Ht 5\' 9"  (1.753 m)  Wt 197 lb (89.359 kg)  BMI 29.08 kg/m2  SpO2 100%  LMP  (LMP Unknown) Physical Exam  Constitutional: She is oriented to person, place, and time. She appears well-developed and well-nourished.  HENT:  Head: Normocephalic and atraumatic.  Eyes: Conjunctivae and EOM are normal. Pupils are equal, round, and reactive to light.  Neck: Normal range of motion. Neck supple.  Cardiovascular: Normal rate and regular rhythm.   Pulmonary/Chest: Effort normal and breath sounds normal.  Abdominal: Soft. Bowel  sounds are normal.  Musculoskeletal:  Minimal tenderness bilateral lower back. No sciatica  Neurological: She is alert and oriented to person, place, and time.  Skin: Skin is warm and dry.  Psychiatric: She has a normal mood and affect. Her behavior is normal.  Nursing note and vitals reviewed.   ED Course  Procedures (including critical care time) Labs Review Labs Reviewed  CBC WITH DIFFERENTIAL/PLATELET - Abnormal; Notable for the following:    WBC 10.9 (*)    All other components within normal limits  URINALYSIS, ROUTINE W REFLEX MICROSCOPIC (NOT AT [facility]) - Abnormal; Notable for the following:    Specific Gravity, Urine >1.030 (*)    Hgb urine dipstick SMALL (*)    All other components within normal limits  URINE MICROSCOPIC-ADD ON - Abnormal; Notable for the following:    Squamous Epithelial / LPF FEW (*)    Bacteria, UA FEW (*)    Crystals CA OXALATE CRYSTALS (*)    All other components within normal limits  CBG MONITORING, ED - Abnormal; Notable for the following:    Glucose-Capillary 131 (*)    All other components within normal limits  BASIC METABOLIC PANEL  PREGNANCY, URINE    Imaging Review Dg Abd 1 View  08/29/2014   CLINICAL DATA:  Back pain for 4 days, history of left-sided renal calculus  EXAM: ABDOMEN - 1 VIEW  COMPARISON:  None.  FINDINGS: The bowel gas pattern is normal. No radio-opaque calculi or other significant radiographic abnormality are seen.  IMPRESSION: No acute abnormality noted.   Electronically Signed   By: Alcide CleverMark  Lukens M.D.   On: 08/29/2014 16:41     EKG Interpretation None      MDM   Final diagnoses:  Bilateral low back pain without sciatica    Patient appears in no acute distress. Urinalysis shows a small amount of hemoglobin, but her history is not suggestive of kidney stone. No obvious urinary tract infection. White count 10.9. Plain film of the abdomen shows no stone. Patient is nontoxic-appearing at discharge. Discharge medications  Naprosyn 500 mg and Flexeril 10 mg    Donnetta HutchingBrian Gillie Crisci, MD 08/29/14 [phone]

## 2014-08-29 NOTE — Discharge Instructions (Signed)
Medications for pain and muscle spasm. Return if worse. Test showed no life-threatening injury

## 2014-12-09 ENCOUNTER — Emergency Department (HOSPITAL_COMMUNITY): Payer: Self-pay

## 2014-12-09 ENCOUNTER — Emergency Department (HOSPITAL_COMMUNITY)
Admission: EM | Admit: 2014-12-09 | Discharge: 2014-12-09 | Disposition: A | Discharge status: Home / Self Care | Payer: Self-pay | Attending: Emergency Medicine | Admitting: Emergency Medicine

## 2014-12-09 ENCOUNTER — Encounter (HOSPITAL_COMMUNITY): Payer: Self-pay | Admitting: Emergency Medicine

## 2014-12-09 DIAGNOSIS — Y9389 Activity, other specified: Secondary | ICD-10-CM | POA: Insufficient documentation

## 2014-12-09 DIAGNOSIS — W1849XA Other slipping, tripping and stumbling without falling, initial encounter: Secondary | ICD-10-CM | POA: Insufficient documentation

## 2014-12-09 DIAGNOSIS — S93401A Sprain of unspecified ligament of right ankle, initial encounter: Secondary | ICD-10-CM | POA: Insufficient documentation

## 2014-12-09 DIAGNOSIS — Z87448 Personal history of other diseases of urinary system: Secondary | ICD-10-CM | POA: Insufficient documentation

## 2014-12-09 DIAGNOSIS — Z8742 Personal history of other diseases of the female genital tract: Secondary | ICD-10-CM | POA: Insufficient documentation

## 2014-12-09 DIAGNOSIS — Y9289 Other specified places as the place of occurrence of the external cause: Secondary | ICD-10-CM | POA: Insufficient documentation

## 2014-12-09 DIAGNOSIS — Y998 Other external cause status: Secondary | ICD-10-CM | POA: Insufficient documentation

## 2014-12-09 DIAGNOSIS — Z720 Tobacco use: Secondary | ICD-10-CM | POA: Insufficient documentation

## 2014-12-09 DIAGNOSIS — Z880 Allergy status to penicillin: Secondary | ICD-10-CM | POA: Insufficient documentation

## 2014-12-09 DIAGNOSIS — Z8619 Personal history of other infectious and parasitic diseases: Secondary | ICD-10-CM | POA: Insufficient documentation

## 2014-12-09 MED ORDER — HYDROCODONE-ACETAMINOPHEN 5-325 MG PO TABS
1.0000 | ORAL_TABLET | Freq: Once | ORAL | Status: AC
  Administered 2014-12-09: 1 via ORAL
  Filled 2014-12-09: qty 1

## 2014-12-09 MED ORDER — NAPROXEN 500 MG PO TABS: 500.0000 mg | ORAL_TABLET | Freq: Two times a day (BID) | ORAL | Status: DC

## 2014-12-09 MED ORDER — HYDROCODONE-ACETAMINOPHEN 5-325 MG PO TABS: 1.0000 | ORAL_TABLET | ORAL | Status: DC | PRN

## 2014-12-09 NOTE — Discharge Instructions (Signed)
Ankle Sprain °An ankle sprain is an injury to the strong, fibrous tissues (ligaments) that hold the bones of your ankle joint together.  °CAUSES °An ankle sprain is usually caused by a fall or by twisting your ankle. Ankle sprains most commonly occur when you step on the outer edge of your foot, and your ankle turns inward. People who participate in sports are more prone to these types of injuries.  °SYMPTOMS  °· Pain in your ankle. The pain may be present at rest or only when you are trying to stand or walk. °· Swelling. °· Bruising. Bruising may develop immediately or within 1 to 2 days after your injury. °· Difficulty standing or walking, particularly when turning corners or changing directions. °DIAGNOSIS  °Your caregiver will ask you details about your injury and perform a physical exam of your ankle to determine if you have an ankle sprain. During the physical exam, your caregiver will press on and apply pressure to specific areas of your foot and ankle. Your caregiver will try to move your ankle in certain ways. An X-ray exam may be done to be sure a bone was not broken or a ligament did not separate from one of the bones in your ankle (avulsion fracture).  °TREATMENT  °Certain types of braces can help stabilize your ankle. Your caregiver can make a recommendation for this. Your caregiver may recommend the use of medicine for pain. If your sprain is severe, your caregiver may refer you to a surgeon who helps to restore function to parts of your skeletal system (orthopedist) or a physical therapist. °HOME CARE INSTRUCTIONS  °· Apply ice to your injury for 1-2 days or as directed by your caregiver. Applying ice helps to reduce inflammation and pain. °¨ Put ice in a plastic bag. °¨ Place a towel between your skin and the bag. °¨ Leave the ice on for 15-20 minutes at a time, every 2 hours while you are awake. °· Only take over-the-counter or prescription medicines for pain, discomfort, or fever as directed by  your caregiver. °· Elevate your injured ankle above the level of your heart as much as possible for 2-3 days. °· If your caregiver recommends crutches, use them as instructed. Gradually put weight on the affected ankle. Continue to use crutches or a cane until you can walk without feeling pain in your ankle. °· If you have a plaster splint, wear the splint as directed by your caregiver. Do not rest it on anything harder than a pillow for the first 24 hours. Do not put weight on it. Do not get it wet. You may take it off to take a shower or bath. °· You may have been given an elastic bandage to wear around your ankle to provide support. If the elastic bandage is too tight (you have numbness or tingling in your foot or your foot becomes cold and blue), adjust the bandage to make it comfortable. °· If you have an air splint, you may blow more air into it or let air out to make it more comfortable. You may take your splint off at night and before taking a shower or bath. Wiggle your toes in the splint several times per day to decrease swelling. °SEEK MEDICAL CARE IF:  °· You have rapidly increasing bruising or swelling. °· Your toes feel extremely cold or you lose feeling in your foot. °· Your pain is not relieved with medicine. °SEEK IMMEDIATE MEDICAL CARE IF: °· Your toes are numb or blue. °·   You have severe pain that is increasing. MAKE SURE YOU:   Understand these instructions.  Will watch your condition.  Will get help right away if you are not doing well or get worse. Document Released: 03/22/2005 Document Revised: 12/15/2011 Document Reviewed: 04/03/2011 [facility] Patient Information 2015 [place], [place]. This information is not intended to replace advice given to you by your health care provider. Make sure you discuss any questions you have with your health care provider.   As discussed,  Without imaging your upper fibula, I cannot say with certainty you do not have an injury at this location.  Wear  the cam walker and use crutches to avoid weight bearing.  Use ice and elevation as much as possible for the next several days to help reduce the swelling.  Take the medications prescribed.  You may take the hydrocodone prescribed for pain relief.  This will make you drowsy - do not drive within 4 hours of taking this medication.  Use the naproxen also for inflammation.  Call the orthopedic doctor listed for a recheck of your injury and further management as discussed.

## 2014-12-09 NOTE — ED Notes (Signed)
Got up and tripped over rug, right ankle injury.  Rates pain 10/10.

## 2014-12-09 NOTE — ED Provider Notes (Signed)
CSN: [id number]     Arrival date & time 12/09/14  1646 History  This chart was scribed for non-physician practitioner, Burgess Amor, PA-C working with Samuel Jester, DO by Gwenyth Ober, ED scribe. This patient was seen in room APFT20/APFT20 and the patient's care was started at [facility] PM   Chief Complaint  Patient presents with  . Ankle Pain    right   The history is provided by the patient. No language interpreter was used.    HPI Comments: Claire Bradley is a 44 y.o. female who presents to the Emergency Department complaining of constant, moderate right ankle pain that radiates to her right knee and started 3 hours ago. She states swelling at her ankle as an associated symptom. Her pain becomes worse with bearing weight and movement. Pt has not tried any treatment PTA. Pt reports onset of pain started after she tripped on a rug and inverted her ankle. She denies numbness.  Past Medical History  Diagnosis Date  . Left ureteral calculus   . History of sepsis     UROSEPSIS / PYELONEPHRITIS--   03-25-2014  . History of PID   . Complication of anesthesia     HARD TO WAKE  . Urgency of urination   . Frequency of urination   . Hematuria    Past Surgical History  Procedure Laterality Date  . Cesarean section  03-11-2003  . Cholecystectomy N/A 08/18/2012    Procedure: LAPAROSCOPIC CHOLECYSTECTOMY;  Surgeon: Fabio Bering, MD;  Location: [facility];  Service: General;  Laterality: N/A;  . Cystoscopy with stent placement Left 03/22/2014    Procedure: CYSTOSCOPY WITH STENT PLACEMENT;  Surgeon: Anner Crete, MD;  Location: [facility];  Service: Urology;  Laterality: Left;  . Abdominal hysterectomy  02/  2015  . Tubal ligation  2006 (approx)  . Shoulder arthroscopy with rotator cuff repair Left 2000  . Cystoscopy/retrograde/ureteroscopy/stone extraction with basket Left 04/11/2014    Procedure: LEFT URETEROSCOPY STONE EXTRACTION WITH BASKET;  Surgeon: Anner Crete, MD;  Location: [facility];  Service: Urology;  Laterality: Left;  . Cystoscopy w/ ureteral stent removal Left 04/11/2014    Procedure: CYSTOSCOPY WITH STENT REMOVAL;  Surgeon: Anner Crete, MD;  Location: [facility];  Service: Urology;  Laterality: Left;   History reviewed. No pertinent family history. Social History  Substance Use Topics  . Smoking status: Current Every Day Smoker -- 0.50 packs/day for 25 years    Types: Cigarettes  . Smokeless tobacco: Never Used  . Alcohol Use: No   OB History    No data available     Review of Systems  Constitutional: Negative for fever.  Musculoskeletal: Positive for joint swelling and arthralgias. Negative for myalgias.  Neurological: Negative for weakness and numbness.   Allergies  Penicillins  Home Medications   Prior to Admission medications   Medication Sig Start Date End Date Taking? Authorizing Provider  ciprofloxacin (CIPRO) 500 MG tablet Take 1 tablet (500 mg total) by mouth 2 (two) times daily. Patient not taking: Reported on 08/29/2014 03/25/14   Gwenyth Bender, NP  cyclobenzaprine (FLEXERIL) 10 MG tablet Take 1 tablet (10 mg total) by mouth 2 (two) times daily as needed for muscle spasms. 08/29/14   Donnetta Hutching, MD  HYDROcodone-acetaminophen (NORCO/VICODIN) 5-325 MG per tablet Take 1 tablet by mouth every 4 (four) hours as needed. 12/09/14   Burgess Amor, PA-C  naproxen (NAPROSYN) 500 MG tablet Take 1 tablet (500 mg  total) by mouth 2 (two) times daily. 12/09/14   Burgess Amor, PA-C  oxyCODONE-acetaminophen (ROXICET) 5-325 MG per tablet Take 1 tablet by mouth every 4 (four) hours as needed for severe pain. Patient not taking: Reported on 08/29/2014 03/22/14   Bjorn Pippin, MD  phenazopyridine (PYRIDIUM) 200 MG tablet Take 1 tablet (200 mg total) by mouth 3 (three) times daily as needed for pain. Patient not taking: Reported on 08/29/2014 03/22/14   Bjorn Pippin, MD   BP 130/84 mmHg  Pulse 101  Temp(Src) 97.9 F (36.6 C) (Oral)  Resp 20  Ht 5\' 9"   (1.753 m)  Wt 200 lb (90.719 kg)  BMI 29.52 kg/m2  SpO2 100%  LMP  (LMP Unknown) Physical Exam  Constitutional: She appears well-developed and well-nourished.  HENT:  Head: Normocephalic.  Cardiovascular: Normal rate and intact distal pulses.  Exam reveals no decreased pulses.   Pulses:      Dorsalis pedis pulses are 2+ on the right side, and 2+ on the left side.       Posterior tibial pulses are 2+ on the right side, and 2+ on the left side.  Musculoskeletal: She exhibits edema and tenderness.       Right ankle: She exhibits decreased range of motion, swelling and ecchymosis. She exhibits normal pulse. Tenderness. Lateral malleolus and proximal fibula tenderness found. No head of 5th metatarsal tenderness found. Achilles tendon normal.  Neurological: She is alert. No sensory deficit.  Skin: Skin is warm, dry and intact.  Nursing note and vitals reviewed.   ED Course  Procedures   DIAGNOSTIC STUDIES: Oxygen Saturation is 100% on RA, normal by my interpretation.    COORDINATION OF CARE: 6:24 PM Discussed treatment plan with pt which includes an x-ray of her tibia/fibula. Pt agreed to plan.  Labs Review Labs Reviewed - No data to display  Imaging Review Dg Ankle Complete Right  12/09/2014   CLINICAL DATA:  Pt states she tripped over a rug and twisted her right ankle. Pt is c/o of right ankle pain on the lateral side.  EXAM: RIGHT ANKLE - COMPLETE 3+ VIEW  COMPARISON:  None.  FINDINGS: Ankle mortise intact. The talar dome is normal. No malleolar fracture. The calcaneus is normal.  IMPRESSION: No fracture or dislocation.   Electronically Signed   By: Genevive Bi M.D.   On: 12/09/2014 17:52     EKG Interpretation None      MDM   Final diagnoses:  Ankle sprain, right, initial encounter    Informed by RN that pt refusing to wait for her tib/fib film, stating it is taking too long.  Discussed reasons for needing this given proximal pain on exam. Pt still refuses.  Advised  patient that she may have more extensive injury beyond ankle sprain and she will need close f/u for further eval and imaging.  She was referred to ortho for this.  Placed in cam walker, crutches given. Advised ice,  Elevation, non weight bearing until re-examined by ortho.  Prescribed naproxen, hydrocodone, RICE. Pt understands she has a partial diagnosis at this time.    I personally performed the services described in this documentation, which was scribed in my presence. The recorded information has been reviewed and is accurate.   Burgess Amor, PA-C 12/10/14 1421  Samuel Jester, DO 12/12/14 2220

## 2014-12-11 ENCOUNTER — Telehealth: Payer: Self-pay | Admitting: Orthopedic Surgery

## 2014-12-11 NOTE — Telephone Encounter (Signed)
Patient called to inquire about self-pay amount for appointment for foot injury following Emergency Room visit at [facility] 12/09/14; provided the information and offered appointment; patient states she will need to call back when she can work it out financially.

## 2015-12-26 ENCOUNTER — Emergency Department (HOSPITAL_COMMUNITY): Payer: Self-pay

## 2015-12-26 ENCOUNTER — Observation Stay (HOSPITAL_COMMUNITY)
Admission: EM | Admit: 2015-12-26 | Discharge: 2015-12-27 | Disposition: A | Discharge status: Home / Self Care | Payer: Self-pay | Attending: Family Medicine | Admitting: Family Medicine

## 2015-12-26 ENCOUNTER — Encounter (HOSPITAL_COMMUNITY): Payer: Self-pay | Admitting: *Deleted

## 2015-12-26 DIAGNOSIS — R197 Diarrhea, unspecified: Secondary | ICD-10-CM

## 2015-12-26 DIAGNOSIS — J189 Pneumonia, unspecified organism: Principal | ICD-10-CM

## 2015-12-26 DIAGNOSIS — F17210 Nicotine dependence, cigarettes, uncomplicated: Secondary | ICD-10-CM | POA: Insufficient documentation

## 2015-12-26 DIAGNOSIS — D72829 Elevated white blood cell count, unspecified: Secondary | ICD-10-CM | POA: Diagnosis present

## 2015-12-26 DIAGNOSIS — R112 Nausea with vomiting, unspecified: Secondary | ICD-10-CM

## 2015-12-26 LAB — PREGNANCY, URINE: Preg Test, Ur: NEGATIVE

## 2015-12-26 LAB — URINE MICROSCOPIC-ADD ON

## 2015-12-26 LAB — CBC
HCT: 41.5 % (ref 36.0–46.0)
Hemoglobin: 14 g/dL (ref 12.0–15.0)
MCH: 32.6 pg (ref 26.0–34.0)
MCHC: 33.7 g/dL (ref 30.0–36.0)
MCV: 96.5 fL (ref 78.0–100.0)
PLATELETS: 310 10*3/uL (ref 150–400)
RBC: 4.3 MIL/uL (ref 3.87–5.11)
RDW: 11.7 % (ref 11.5–15.5)
WBC: 20.1 10*3/uL — ABNORMAL HIGH (ref 4.0–10.5)

## 2015-12-26 LAB — URINALYSIS, ROUTINE W REFLEX MICROSCOPIC
BILIRUBIN URINE: NEGATIVE
GLUCOSE, UA: NEGATIVE mg/dL
KETONES UR: NEGATIVE mg/dL
Leukocytes, UA: NEGATIVE
Nitrite: NEGATIVE
Protein, ur: 100 mg/dL — AB
Specific Gravity, Urine: 1.02 (ref 1.005–1.030)
pH: 6 (ref 5.0–8.0)

## 2015-12-26 LAB — COMPREHENSIVE METABOLIC PANEL
ALBUMIN: 4.4 g/dL (ref 3.5–5.0)
ALT: 13 U/L — AB (ref 14–54)
AST: 15 U/L (ref 15–41)
Alkaline Phosphatase: 60 U/L (ref 38–126)
Anion gap: 9 (ref 5–15)
BUN: 6 mg/dL (ref 6–20)
CO2: 25 mmol/L (ref 22–32)
CREATININE: 0.59 mg/dL (ref 0.44–1.00)
Calcium: 9.4 mg/dL (ref 8.9–10.3)
Chloride: 103 mmol/L (ref 101–111)
GFR calc Af Amer: >60 mL/min (ref >60)
GFR calc non Af Amer: >60 mL/min (ref >60)
Glucose, Bld: 95 mg/dL (ref 65–99)
Potassium: 3.5 mmol/L (ref 3.5–5.1)
Sodium: 137 mmol/L (ref 135–145)
Total Bilirubin: 0.6 mg/dL (ref 0.3–1.2)
Total Protein: 7.8 g/dL (ref 6.5–8.1)

## 2015-12-26 LAB — D-DIMER, QUANTITATIVE: D-Dimer, Quant: 0.78 ug/mL-FEU — ABNORMAL HIGH (ref 0.00–0.50)

## 2015-12-26 LAB — TROPONIN I: Troponin I: <0.03 ng/mL (ref <0.03)

## 2015-12-26 LAB — LIPASE, BLOOD: LIPASE: 17 U/L (ref 11–51)

## 2015-12-26 LAB — POC URINE PREG, ED: Preg Test, Ur: NEGATIVE

## 2015-12-26 MED ORDER — FENTANYL CITRATE (PF) 100 MCG/2ML IJ SOLN
25.0000 ug | INTRAMUSCULAR | Status: DC | PRN
  Administered 2015-12-27 (×3): 25 ug via INTRAVENOUS
  Filled 2015-12-26 (×3): qty 2

## 2015-12-26 MED ORDER — DEXTROSE 5 % IV SOLN
500.0000 mg | Freq: Once | INTRAVENOUS | Status: AC
  Administered 2015-12-26: 500 mg via INTRAVENOUS
  Filled 2015-12-26: qty 500

## 2015-12-26 MED ORDER — ONDANSETRON HCL 4 MG/2ML IJ SOLN
4.0000 mg | Freq: Four times a day (QID) | INTRAMUSCULAR | Status: DC | PRN
  Administered 2015-12-26: 4 mg via INTRAVENOUS
  Filled 2015-12-26: qty 2

## 2015-12-26 MED ORDER — ENOXAPARIN SODIUM 40 MG/0.4ML ~~LOC~~ SOLN: 40.0000 mg | SUBCUTANEOUS | Status: DC

## 2015-12-26 MED ORDER — DEXTROSE 5 % IV SOLN
1.0000 g | INTRAVENOUS | Status: DC
  Filled 2015-12-26 (×2): qty 10

## 2015-12-26 MED ORDER — IOPAMIDOL (ISOVUE-370) INJECTION 76%
100.0000 mL | Freq: Once | INTRAVENOUS | Status: AC | PRN
  Administered 2015-12-26: 100 mL via INTRAVENOUS

## 2015-12-26 MED ORDER — OXYCODONE HCL 5 MG PO TABS: 5.0000 mg | ORAL_TABLET | Freq: Four times a day (QID) | ORAL | Status: DC | PRN

## 2015-12-26 MED ORDER — DEXTROSE 5 % IV SOLN
500.0000 mg | INTRAVENOUS | Status: DC
  Filled 2015-12-26 (×2): qty 500

## 2015-12-26 MED ORDER — ONDANSETRON HCL 4 MG/2ML IJ SOLN
4.0000 mg | Freq: Once | INTRAMUSCULAR | Status: AC
  Administered 2015-12-26: 4 mg via INTRAVENOUS
  Filled 2015-12-26: qty 2

## 2015-12-26 MED ORDER — MORPHINE SULFATE (PF) 4 MG/ML IV SOLN
4.0000 mg | Freq: Once | INTRAVENOUS | Status: AC
  Administered 2015-12-26: 4 mg via INTRAVENOUS
  Filled 2015-12-26: qty 1

## 2015-12-26 MED ORDER — DEXTROSE 5 % IV SOLN
1.0000 g | Freq: Once | INTRAVENOUS | Status: AC
  Administered 2015-12-26: 1 g via INTRAVENOUS
  Filled 2015-12-26: qty 10

## 2015-12-26 MED ORDER — FENTANYL CITRATE (PF) 100 MCG/2ML IJ SOLN
100.0000 ug | INTRAMUSCULAR | Status: AC | PRN
  Administered 2015-12-26 (×3): 100 ug via INTRAVENOUS
  Filled 2015-12-26 (×3): qty 2

## 2015-12-26 MED ORDER — SODIUM CHLORIDE 0.9 % IV SOLN
INTRAVENOUS | Status: DC
  Administered 2015-12-27 (×2): via INTRAVENOUS

## 2015-12-26 MED ORDER — ONDANSETRON HCL 4 MG/2ML IJ SOLN: 4.0000 mg | Freq: Four times a day (QID) | INTRAMUSCULAR | Status: DC | PRN

## 2015-12-26 MED ORDER — SODIUM CHLORIDE 0.9 % IV BOLUS (SEPSIS)
1000.0000 mL | Freq: Once | INTRAVENOUS | Status: AC
  Administered 2015-12-26: 1000 mL via INTRAVENOUS

## 2015-12-26 NOTE — ED Notes (Signed)
Pt moved over to room 10 in the ER.  C/o LUQ abdominal pain .  Rates  A 9/10.  Pt moaning.

## 2015-12-26 NOTE — ED Triage Notes (Signed)
Pt is having LUQ abdominal pain starting 3 days ago. She began having vomiting today, denies diarrhea.

## 2015-12-26 NOTE — ED Provider Notes (Signed)
[facility] Provider Note   CSN: [id number] Arrival date & time: 12/26/15  1337     History   Chief Complaint Chief Complaint  Patient presents with  . Abdominal Pain    HPI Claire Bradley is a 45 y.o. female.  Claire Bradley is a 45 y.o. Female who presents to the ED complaining of 3 days of the left upper quadrant abdominal pain with associated subjective fevers, coughing, diarrhea, pain with deep inspiration and nausea and vomiting beginning today. She reports three days ago she had subjective fevers and pain in her LUQ underneath her ribs. She reports some increased cough and pain with deep inspiration. She reports nausea, and vomiting starting today. She reports diarrhea for the past two days. She has taken nothing for treatment today. She denies history of PE, DVT or MI. She is a smoker but has cut back recently due to pain with deep inspiration. She denies hemoptysis, hematochezia, hematemesis, urinary symptoms, or rashes.   The history is provided by the patient. No language interpreter was used.  Abdominal Pain   Associated symptoms include fever, diarrhea, nausea and vomiting. Pertinent negatives include constipation, dysuria, frequency and headaches.    Past Medical History:  Diagnosis Date  . Complication of anesthesia    HARD TO WAKE  . Frequency of urination   . Hematuria   . History of PID   . History of sepsis    UROSEPSIS / PYELONEPHRITIS--   03-25-2014  . Left ureteral calculus   . Urgency of urination     Patient Active Problem List   Diagnosis Date Noted  . CAP (community acquired pneumonia) 12/26/2015  . Flank pain   . Pyelonephritis 03/22/2014  . Acute urethral obstruction 03/22/2014  . Urethral stone 03/22/2014  . Tachycardia 03/22/2014  . Leukocytosis 03/22/2014  . Hypokalemia 03/22/2014  . Sepsis (HCC) 03/22/2014    Past Surgical History:  Procedure Laterality Date  . ABDOMINAL HYSTERECTOMY  02/  2015  . CESAREAN SECTION   03-11-2003  . CHOLECYSTECTOMY N/A 08/18/2012   Procedure: LAPAROSCOPIC CHOLECYSTECTOMY;  Surgeon: Fabio Bering, MD;  Location: [facility];  Service: General;  Laterality: N/A;  . CYSTOSCOPY W/ URETERAL STENT REMOVAL Left 04/11/2014   Procedure: CYSTOSCOPY WITH STENT REMOVAL;  Surgeon: Anner Crete, MD;  Location: [facility];  Service: Urology;  Laterality: Left;  . CYSTOSCOPY WITH STENT PLACEMENT Left 03/22/2014   Procedure: CYSTOSCOPY WITH STENT PLACEMENT;  Surgeon: Anner Crete, MD;  Location: [facility];  Service: Urology;  Laterality: Left;  . CYSTOSCOPY/RETROGRADE/URETEROSCOPY/STONE EXTRACTION WITH BASKET Left 04/11/2014   Procedure: LEFT URETEROSCOPY STONE EXTRACTION WITH BASKET;  Surgeon: Anner Crete, MD;  Location: [facility];  Service: Urology;  Laterality: Left;  . SHOULDER ARTHROSCOPY WITH ROTATOR CUFF REPAIR Left 2000  . TUBAL LIGATION  2006 (approx)    OB History    No data available       Home Medications    Prior to Admission medications   Not on File    Family History No family history on file.  Social History Social History  Substance Use Topics  . Smoking status: Current Every Day Smoker    Packs/day: 0.50    Years: 25.00    Types: Cigarettes  . Smokeless tobacco: Never Used  . Alcohol use No     Allergies   Penicillins   Review of Systems Review of Systems  Constitutional: Positive for chills and fever.  HENT: Negative for  congestion and sore throat.   Eyes: Negative for visual disturbance.  Respiratory: Positive for cough and shortness of breath. Negative for chest tightness and wheezing.   Cardiovascular: Negative for chest pain.  Gastrointestinal: Positive for abdominal pain, diarrhea, nausea and vomiting. Negative for abdominal distention, blood in stool and constipation.  Genitourinary: Negative for difficulty urinating, dysuria, frequency and urgency.  Musculoskeletal: Negative for back pain and neck pain.  Skin:  Negative for rash.  Neurological: Negative for dizziness, syncope, light-headedness and headaches.     Physical Exam Updated Vital Signs BP 121/74   Pulse 79   Temp 98.4 F (36.9 C) (Rectal)   Resp 15   LMP  (LMP Unknown)   SpO2 95%   Physical Exam  Constitutional: She appears well-developed and well-nourished. No distress.  Nontoxic-appearing. Patient appears uncomfortable.  HENT:  Head: Normocephalic and atraumatic.  Mouth/Throat: Oropharynx is clear and moist.  Eyes: Conjunctivae are normal. Pupils are equal, round, and reactive to light. Right eye exhibits no discharge. Left eye exhibits no discharge.  Neck: Neck supple. No JVD present. No tracheal deviation present.  Cardiovascular: Normal rate, regular rhythm, normal heart sounds and intact distal pulses.  Exam reveals no gallop and no friction rub.   No murmur heard. Pulmonary/Chest: Effort normal and breath sounds normal. No stridor. No respiratory distress. She has no wheezes. She has no rales.  Lungs clear to auscultation bilaterally.  Abdominal: Soft. Bowel sounds are normal. She exhibits no distension and no mass. There is tenderness. There is no rebound and no guarding.  Abdomen is soft. Bowel sounds present. Patient has left upper quadrant abdominal tenderness to palpation.  Musculoskeletal: She exhibits no edema.  Lymphadenopathy:    She has no cervical adenopathy.  Neurological: She is alert. Coordination normal.  Skin: Skin is warm and dry. Capillary refill takes less than 2 seconds. No rash noted. She is not diaphoretic. No erythema. No pallor.  Psychiatric: She has a normal mood and affect. Her behavior is normal.  Nursing note and vitals reviewed.    ED Treatments / Results  Labs (all labs ordered are listed, but only abnormal results are displayed) Labs Reviewed  COMPREHENSIVE METABOLIC PANEL - Abnormal; Notable for the following:       Result Value   ALT 13 (*)    All other components within normal  limits  CBC - Abnormal; Notable for the following:    WBC 20.1 (*)    All other components within normal limits  URINALYSIS, ROUTINE W REFLEX MICROSCOPIC (NOT AT [facility]) - Abnormal; Notable for the following:    Hgb urine dipstick SMALL (*)    Protein, ur 100 (*)    All other components within normal limits  D-DIMER, QUANTITATIVE (NOT AT [facility]) - Abnormal; Notable for the following:    D-Dimer, Quant 0.78 (*)    All other components within normal limits  URINE MICROSCOPIC-ADD ON - Abnormal; Notable for the following:    Squamous Epithelial / LPF 6-30 (*)    Bacteria, UA MANY (*)    Casts GRANULAR CAST (*)    Crystals CA OXALATE CRYSTALS (*)    All other components within normal limits  CULTURE, BLOOD (ROUTINE X 2)  CULTURE, BLOOD (ROUTINE X 2)  CULTURE, EXPECTORATED SPUTUM-ASSESSMENT  GRAM STAIN  LIPASE, BLOOD  PREGNANCY, URINE  TROPONIN I  HIV ANTIBODY (ROUTINE TESTING)  STREP PNEUMONIAE URINARY ANTIGEN  CBC  COMPREHENSIVE METABOLIC PANEL  POC URINE PREG, ED    EKG  EKG Interpretation  None       Radiology Dg Chest 2 View  Result Date: 12/26/2015 CLINICAL DATA:  Three-day history of chest pain and shortness of breath EXAM: CHEST  2 VIEW COMPARISON:  February 21, 2013 FINDINGS: There is no edema or consolidation. The heart size and pulmonary vascularity are normal. No adenopathy. There is mild degenerative change in the thoracic spine. IMPRESSION: No edema or consolidation. Electronically Signed   By: Bretta BangWilliam  Woodruff III M.D.   On: 12/26/2015 16:23   Ct Angio Chest Pe W And/or Wo Contrast  Result Date: 12/26/2015 CLINICAL DATA:  Left upper quadrant abdominal pain starting 3 days ago. Vomiting today. EXAM: CT ANGIOGRAPHY CHEST WITH CONTRAST TECHNIQUE: Multidetector CT imaging of the chest was performed using the standard protocol during bolus administration of intravenous contrast. Multiplanar CT image reconstructions and MIPs were obtained to evaluate the vascular anatomy.  CONTRAST:  100 cc of Isovue 370 COMPARISON:  Chest radiograph of earlier today.  No prior CT. FINDINGS: Cardiovascular: The quality of this exam for evaluation of pulmonary embolism is moderate to good. Bolus is suboptimally timed, with contrast centered in the SVC. No evidence of pulmonary embolism. 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 Otherwise normal thoracic aorta. Normal heart size, without pericardial effusion. Mediastinum/Nodes: No mediastinal or hilar adenopathy. Minimal residual thymus in the anterior mediastinum. Lungs/Pleura: Small left pleural effusion. Trace right pleural fluid. Minimal susceptibility artifact from EKG leads. Minimal centrilobular emphysema. Dependent lingular and left greater than right subsegmental airspace opacities. Upper Abdomen: Normal imaged portions of the liver, spleen, stomach, pancreas, right adrenal gland, kidneys. Left adrenal thickening with maintenance of adreniform shape. Musculoskeletal: Mild accentuation of expected thoracic kyphosis. Review of the MIP images confirms the above findings. IMPRESSION: 1. No evidence of pulmonary embolism. Mildly degraded by suboptimal bolus timing. 2. Left greater than right small pleural effusions. Bibasilar dependent atelectasis. Patchy lingular opacity could also represent atelectasis or localized infection. 3. 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 Electronically Signed   By: Jeronimo GreavesKyle  Talbot M.D.   On: 12/26/2015 19:26    Procedures Procedures (including critical care time)  Medications Ordered in ED Medications  ondansetron (ZOFRAN) injection 4 mg (4 mg Intravenous Given 12/26/15 2052)  enoxaparin (LOVENOX) injection 40 mg (not administered)  cefTRIAXone (ROCEPHIN) 1 g in dextrose 5 % 50 mL IVPB (not administered)  azithromycin (ZITHROMAX) 500 mg in dextrose 5 % 250 mL IVPB (not administered)  0.9 %  sodium chloride infusion (not administered)  sodium  chloride 0.9 % bolus 1,000 mL (0 mLs Intravenous Stopped 12/26/15 1926)  ondansetron (ZOFRAN) injection 4 mg (4 mg Intravenous Given 12/26/15 1649)  morphine 4 MG/ML injection 4 mg (4 mg Intravenous Given 12/26/15 1649)  fentaNYL (SUBLIMAZE) injection 100 mcg (100 mcg Intravenous Given 12/26/15 2052)  iopamidol (ISOVUE-370) 76 % injection 100 mL (100 mLs Intravenous Contrast Given 12/26/15 1906)  cefTRIAXone (ROCEPHIN) 1 g in dextrose 5 % 50 mL IVPB (0 g Intravenous Stopped 12/26/15 2027)  azithromycin (ZITHROMAX) 500 mg in dextrose 5 % 250 mL IVPB (500 mg Intravenous Transfusing/Transfer 12/26/15 2028)     Initial Impression / Assessment and Plan / ED Course  I have reviewed the triage vital signs and the nursing notes.  Pertinent labs & imaging results that were available during my care of the patient were reviewed by me and considered in my medical decision making (see chart for details).  Clinical Course   This is a 45 y.o. Female who presents  to the ED complaining of 3 days of left upper quadrant abdominal pain with associated subjective fevers, coughing, diarrhea, pain with deep inspiration and nausea and vomiting beginning today. She reports three days ago she had subjective fevers and pain in her LUQ underneath her ribs. She reports some increased cough and pain with deep inspiration. She reports nausea, and vomiting starting today. She reports diarrhea for the past two days. She has taken nothing for treatment today. She denies history of PE, DVT or MI. She is a smoker but has cut back recently due to pain with deep inspiration. On exam the patient is afebrile and nontoxic appearing. She appears uncomfortable. Her lungs are clear to auscultation bilaterally. She reports pain with deep inspiration. Her abdomen is soft and nontender to palpation. No real abdominal tenderness to palpation. Concern is for pneumonia or possibly PE. She does have some mild tachycardia with a heart rate of 102 during a  recheck. Chest x-ray showed no acute cardiopulmonary findings. CBC is remarkable for a white count of 20,000. She is afebrile. CMP is unremarkable. Lipase is within normal limits. Troponin is not elevated. D-dimer is elevated at 0.78. CT angiogram was obtained which showed no evidence of PE. It did show left greater than right small pleural effusion and patchy lingular opacity which could represent atelectasis or localized infection. After discussion with my attending, Dr. Estell Harpin, will admit for treatment for community-acquired pneumonia. Patient started on antibiotics for community-acquired pneumonia. Patient agrees with plan for admission.   I spoke with hospitalist Dr. Sharl Ma who accepted the patient for admission and requested temp orders for med surg bed.   This patient was discussed with and evaluated by Dr. Estell Harpin who agrees with assessment and plan.   Final Clinical Impressions(s) / ED Diagnoses   Final diagnoses:  CAP (community acquired pneumonia)  Nausea vomiting and diarrhea    New Prescriptions There are no discharge medications for this patient.    Everlene Farrier, PA-C 12/26/15 2151    Bethann Berkshire, MD 12/29/15 [phone]

## 2015-12-26 NOTE — H&P (Signed)
TRH H&P   Patient Demographics:    Claire Bradley, is a 45 y.o. female  MRN: [id number]  DOB - 03-Aug-1970  Admit Date - 12/26/2015  Outpatient Primary MD for the patient is Pcp Not In System  Referring MD/NP/PA: Everlene FarrierWilliam Dansie  Patient coming from: Home  Chief Complaint  Patient presents with  . Abdominal Pain      HPI:    Claire Loatricia Wissing  is a 45 y.o. female, History of ureteral  stone, pyelonephritis who came to the hospital with left upper quadrant pain for last 3 days. Pain becomes worse on deep breathing, also complains of shortness of breath. Patient has been coughing up phlegm, usually clear. Borderline coming to the ED patient developed nausea and vomiting. Also complains of diarrhea for the past 2 days. She denies dysuria urgency or frequency of urination. Positive history of fever  In the ED CT angiogram chest was done as patient had elevated d-dimer, CTA was negative for PE. Showed left greater than right small pleural effusion, patchy lingular opacity could also represent atelectasis or localized infection.     Review of systems:      A full 10 point Review of Systems was done, except as stated above, all other Review of Systems were negative.   With Past History of the following :    Past Medical History:  Diagnosis Date  . Complication of anesthesia    HARD TO WAKE  . Frequency of urination   . Hematuria   . History of PID   . History of sepsis    UROSEPSIS / PYELONEPHRITIS--   03-25-2014  . Left ureteral calculus   . Urgency of urination       Past Surgical History:  Procedure Laterality Date  . ABDOMINAL HYSTERECTOMY  02/  2015  . CESAREAN SECTION  03-11-2003  . CHOLECYSTECTOMY N/A 08/18/2012   Procedure: LAPAROSCOPIC CHOLECYSTECTOMY;  Surgeon: Fabio BeringBrent C Ziegler, MD;  Location: [facility];  Service: General;  Laterality: N/A;  . CYSTOSCOPY W/ URETERAL STENT REMOVAL Left  04/11/2014   Procedure: CYSTOSCOPY WITH STENT REMOVAL;  Surgeon: Anner CreteJohn J Wrenn, MD;  [facility] [place];  Service: Urology;  Laterality: Left;  . CYSTOSCOPY WITH STENT PLACEMENT Left 03/22/2014   Procedure: CYSTOSCOPY WITH STENT PLACEMENT;  Surgeon: Anner CreteJohn J Wrenn, MD;  Location: [facility];  Service: Urology;  Laterality: Left;  . CYSTOSCOPY/RETROGRADE/URETEROSCOPY/STONE EXTRACTION WITH BASKET Left 04/11/2014   Procedure: LEFT URETEROSCOPY STONE EXTRACTION WITH BASKET;  Surgeon: Anner CreteJohn J Wrenn, MD;  [facility] [place];  Service: Urology;  Laterality: Left;  . SHOULDER ARTHROSCOPY WITH ROTATOR CUFF REPAIR Left 2000  . TUBAL LIGATION  2006 (approx)      Social History:     Social History  Substance Use Topics  . Smoking status: Current Every Day Smoker    Packs/day: 0.50    Years: 25.00    Types: Cigarettes  . Smokeless tobacco: Never Used  . Alcohol use No  Family History :    Positive family history of cancer on mother's side   Home Medications:   Prior to Admission medications   Not on File     Allergies:     Allergies  Allergen Reactions  . Penicillins Nausea And Vomiting    SEVERE     Physical Exam:   Vitals  Blood pressure 121/74, pulse 79, temperature 98.4 F (36.9 C), temperature source Rectal, resp. rate 15, SpO2 95 %.  1.  General Appears in mild distress.  2. Psychiatric: Intact judgement and  insight, awake alert, oriented x 3.  3. Neurologic:No focal neurological deficits, all cranial nerves intact. Strength 5/5 all 4 extremities, sensation intact all 4 extremities, plantars down going.  4. Eyes show anicteric sclerae, moist conjunctivae with no lid lag. PERRLA.  5. ENMT: Oropharynx clear with moist mucous membranes and good dentition  5. Neck: supple, no cervical lymphadenopathy appriciated, No thyromegaly  6. Respiratory :Normal respiratory effort, good air movement bilaterally, clear to auscultation  bilaterally. Positive tenderness noted in the left lower rib cage  7. Cardiovascular :RRR, no gallops, rubs or murmurs, no leg edema  8. GI: Positive bowel sounds, abdomen soft, no tenderness to palpation, no organomegaly appriciated,no rebound -guarding or rigidity.  9. Skin: No cyanosis, normal texture and turgor, no rash, lesions or ulcers  10.Musculoskeletal: Good muscle tone,  joints appear normal , no effusions, normal range of motion         Data Review:    CBC  Recent Labs Lab 12/26/15 1423  WBC 20.1*  HGB 14.0  HCT 41.5  PLT 310  MCV 96.5  MCH 32.6  MCHC 33.7  RDW 11.7   ------------------------------------------------------------------------------------------------------------------  Chemistries   Recent Labs Lab 12/26/15 1423  NA 137  K 3.5  CL 103  CO2 25  GLUCOSE 95  BUN 6  CREATININE 0.59  CALCIUM 9.4  AST 15  ALT 13*  ALKPHOS 60  BILITOT 0.6   ------------------------------------------------------------------------------------------------------------------  ------------------------------------------------------------------------------------------------------------------ GFR: CrCl cannot be calculated (Unknown ideal weight.). Liver Function Tests:  Recent Labs Lab 12/26/15 1423  AST 15  ALT 13*  ALKPHOS 60  BILITOT 0.6  PROT 7.8  ALBUMIN 4.4    Recent Labs Lab 12/26/15 1423  LIPASE 17   No results for input(s): AMMONIA in the last 168 hours. Coagulation Profile: No results for input(s): INR, PROTIME in the last 168 hours. Cardiac Enzymes:  Recent Labs Lab 12/26/15 1423  TROPONINI <0.03    --------------------------------------------------------------------------------------------------------------- Urine analysis:    Component Value Date/Time   COLORURINE YELLOW 12/26/2015 1343   APPEARANCEUR CLEAR 12/26/2015 1343   LABSPEC 1.020 12/26/2015 1343   PHURINE 6.0 12/26/2015 1343   GLUCOSEU NEGATIVE 12/26/2015 1343    HGBUR SMALL (A) 12/26/2015 1343   BILIRUBINUR NEGATIVE 12/26/2015 1343   KETONESUR NEGATIVE 12/26/2015 1343   PROTEINUR 100 (A) 12/26/2015 1343   UROBILINOGEN 0.2 08/29/2014 1454   NITRITE NEGATIVE 12/26/2015 1343   LEUKOCYTESUR NEGATIVE 12/26/2015 1343      ----------------------------------------------------------------------------------------------------------------   Imaging Results:    Dg Chest 2 View  Result Date: 12/26/2015 CLINICAL DATA:  Three-day history of chest pain and shortness of breath EXAM: CHEST  2 VIEW COMPARISON:  February 21, 2013 FINDINGS: There is no edema or consolidation. The heart size and pulmonary vascularity are normal. No adenopathy. There is mild degenerative change in the thoracic spine. IMPRESSION: No edema or consolidation. Electronically Signed   By: Bretta Bang III M.D.   On: 12/26/2015 16:23  Ct Angio Chest Pe W And/or Wo Contrast  Result Date: 12/26/2015 CLINICAL DATA:  Left upper quadrant abdominal pain starting 3 days ago. Vomiting today. EXAM: CT ANGIOGRAPHY CHEST WITH CONTRAST TECHNIQUE: Multidetector CT imaging of the chest was performed using the standard protocol during bolus administration of intravenous contrast. Multiplanar CT image reconstructions and MIPs were obtained to evaluate the vascular anatomy. CONTRAST:  100 cc of Isovue 370 COMPARISON:  Chest radiograph of earlier today.  No prior CT. FINDINGS: Cardiovascular: The quality of this exam for evaluation of pulmonary embolism is moderate to good. Bolus is suboptimally timed, with contrast centered in the SVC. No evidence of pulmonary embolism. 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 Otherwise normal thoracic aorta. Normal heart size, without pericardial effusion. Mediastinum/Nodes: No mediastinal or hilar adenopathy. Minimal residual thymus in the anterior mediastinum. Lungs/Pleura: Small left pleural effusion. Trace right pleural fluid. Minimal  susceptibility artifact from EKG leads. Minimal centrilobular emphysema. Dependent lingular and left greater than right subsegmental airspace opacities. Upper Abdomen: Normal imaged portions of the liver, spleen, stomach, pancreas, right adrenal gland, kidneys. Left adrenal thickening with maintenance of adreniform shape. Musculoskeletal: Mild accentuation of expected thoracic kyphosis. Review of the MIP images confirms the above findings. IMPRESSION: 1. No evidence of pulmonary embolism. Mildly degraded by suboptimal bolus timing. 2. Left greater than right small pleural effusions. Bibasilar dependent atelectasis. Patchy lingular opacity could also represent atelectasis or localized infection. 3. 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 Electronically Signed   By: Jeronimo Greaves M.D.   On: 12/26/2015 19:26    My personal review of EKG: Rhythm NSR   Assessment & Plan:    Active Problems:   Leukocytosis   CAP (community acquired pneumonia)   1. Community acquired pneumonia- replace under observation, start ceftriaxone and Zithromax. Follow blood culture, sputum culture, urine strep pneumo antigen, HIV antibody, Legionella antigen. 2. Leukocytosis- WBC is 20,000, secondary to above. Follow CBC in a.m.   DVT Prophylaxis-   Lovenox   AM Labs Ordered, also please review Full Orders  Family Communication: Discussed with patient's son at bedside  Code Status:  Full code  Admission status: Observation    Time spent in minutes : 60 minutes   LAMA,GAGAN S M.D on 12/26/2015 at 8:33 PM  Between 7am to 7pm - Pager - [phone]. After 7pm go to [email or web address] - password [facility]  [facility] - Office  [phone]

## 2015-12-26 NOTE — ED Notes (Signed)
Patient transported to CT 

## 2015-12-27 ENCOUNTER — Encounter (HOSPITAL_COMMUNITY): Payer: Self-pay

## 2015-12-27 DIAGNOSIS — J189 Pneumonia, unspecified organism: Secondary | ICD-10-CM

## 2015-12-27 DIAGNOSIS — D72829 Elevated white blood cell count, unspecified: Secondary | ICD-10-CM

## 2015-12-27 LAB — CBC
HCT: 36 % (ref 36.0–46.0)
HEMOGLOBIN: 11.9 g/dL — AB (ref 12.0–15.0)
MCH: 32.4 pg (ref 26.0–34.0)
MCHC: 33.1 g/dL (ref 30.0–36.0)
MCV: 98.1 fL (ref 78.0–100.0)
PLATELETS: 265 10*3/uL (ref 150–400)
RBC: 3.67 MIL/uL — ABNORMAL LOW (ref 3.87–5.11)
RDW: 11.8 % (ref 11.5–15.5)
WBC: 14.1 10*3/uL — ABNORMAL HIGH (ref 4.0–10.5)

## 2015-12-27 LAB — COMPREHENSIVE METABOLIC PANEL
ALBUMIN: 3.5 g/dL (ref 3.5–5.0)
ALT: 78 U/L — ABNORMAL HIGH (ref 14–54)
ANION GAP: 6 (ref 5–15)
AST: 82 U/L — ABNORMAL HIGH (ref 15–41)
Alkaline Phosphatase: 69 U/L (ref 38–126)
BUN: 5 mg/dL — ABNORMAL LOW (ref 6–20)
CHLORIDE: 104 mmol/L (ref 101–111)
CO2: 25 mmol/L (ref 22–32)
Calcium: 8.3 mg/dL — ABNORMAL LOW (ref 8.9–10.3)
Creatinine, Ser: 0.47 mg/dL (ref 0.44–1.00)
GFR calc Af Amer: >60 mL/min (ref >60)
GFR calc non Af Amer: >60 mL/min (ref >60)
GLUCOSE: 90 mg/dL (ref 65–99)
POTASSIUM: 3.5 mmol/L (ref 3.5–5.1)
SODIUM: 135 mmol/L (ref 135–145)
Total Bilirubin: 0.6 mg/dL (ref 0.3–1.2)
Total Protein: 6.3 g/dL — ABNORMAL LOW (ref 6.5–8.1)

## 2015-12-27 MED ORDER — CEFUROXIME AXETIL 500 MG PO TABS
500.0000 mg | ORAL_TABLET | Freq: Two times a day (BID) | ORAL | 0 refills | Status: AC
Start: 2015-12-27 — End: 2016-01-05

## 2015-12-27 MED ORDER — AZITHROMYCIN 500 MG PO TABS: 500.0000 mg | ORAL_TABLET | Freq: Every day | ORAL | 0 refills | Status: AC

## 2015-12-27 NOTE — Progress Notes (Signed)
Discussed with patient discharge instructions, she verbalized agreement and understanding.  Patient's IV was discontinued with no complications.  Patient to go ome with all belongings to go home in private vehicle.

## 2015-12-27 NOTE — Progress Notes (Signed)
ANTIBIOTIC CONSULT NOTE - INITIAL  Pharmacy Consult for Renal adjustment antibiotics Indication: pneumonia  Allergies  Allergen Reactions  . Penicillins Nausea And Vomiting    SEVERE    Patient Measurements: Height: 5\' 5"  (165.1 cm) Weight: 208 lb 4.8 oz (94.5 kg) IBW/kg (Calculated) : 57 Adjusted Body Weight:   Vital Signs: Temp: 98.6 F (37 C) (09/23 0557) Temp Source: Oral (09/23 0557) BP: 122/80 (09/23 0557) Pulse Rate: 89 (09/23 0557) Intake/Output from previous day: No intake/output data recorded. Intake/Output from this shift: No intake/output data recorded.  Labs:  Recent Labs  12/26/15 1423 12/27/15 0634  WBC 20.1* 14.1*  HGB 14.0 11.9*  PLT 310 265  CREATININE 0.59 0.47   Estimated Creatinine Clearance: 103.1 mL/min (by C-G formula based on SCr of 0.47 mg/dL). No results for input(s): VANCOTROUGH, VANCOPEAK, VANCORANDOM, GENTTROUGH, GENTPEAK, GENTRANDOM, TOBRATROUGH, TOBRAPEAK, TOBRARND, AMIKACINPEAK, AMIKACINTROU, AMIKACIN in the last 72 hours.   Microbiology: Recent Results (from the past 720 hour(s))  Culture, blood (routine x 2) Call MD if unable to obtain prior to antibiotics being given     Status: None (Preliminary result)   Collection Time: 12/26/15  9:06 PM  Result Value Ref Range Status   Specimen Description BLOOD LEFT ARM  Final   Special Requests BOTTLES DRAWN AEROBIC AND ANAEROBIC 6CC  Final   Culture NO GROWTH < 12 HOURS  Final   Report Status PENDING  Incomplete  Culture, blood (routine x 2) Call MD if unable to obtain prior to antibiotics being given     Status: None (Preliminary result)   Collection Time: 12/26/15  9:09 PM  Result Value Ref Range Status   Specimen Description BLOOD LEFT HAND  Final   Special Requests BOTTLES DRAWN AEROBIC AND ANAEROBIC 6CC  Final   Culture NO GROWTH < 12 HOURS  Final   Report Status PENDING  Incomplete    Medical History: Past Medical History:  Diagnosis Date  . Complication of anesthesia    HARD TO WAKE  . Frequency of urination   . Hematuria   . History of PID   . History of sepsis    UROSEPSIS / PYELONEPHRITIS--   03-25-2014  . Left ureteral calculus   . Urgency of urination     Medications:  Scheduled:  . azithromycin  500 mg Intravenous Q24H  . cefTRIAXone (ROCEPHIN)  IV  1 g Intravenous Q24H  . enoxaparin (LOVENOX) injection  40 mg Subcutaneous Q24H   Assessment: 45 yo female admitted community acquired pneumonia Azithromycin and Ceftriaxone started  Goal of Therapy:  Eradicate infection  Plan:  Continue Ceftriaxone 1 GM IV every 24 hours Continue Azithromycin 500 mg IV every 24 hours F/U oral therapy when appropriate Labs per [employer]  Dewanna Hurston Bennett 12/27/2015,11:02 AM

## 2015-12-27 NOTE — Discharge Summary (Signed)
Physician Discharge Summary  Claire Bradley WUJ:[id number] DOB: 08/28/1970 DOA: 12/26/2015  PCP: Pcp Not In System  Admit date: 12/26/2015 Discharge date: 12/27/2015  Admitted From: Home Disposition:  Home  Recommendations for Outpatient Follow-up:  1. Follow up with health department at soonest available appointment 2. Need follow up urinalysis and liver enzyme testing at the health department  Home Health: No Equipment/Devices: None  Discharge Condition: Stable and improved CODE STATUS:Full code Diet recommendation: Regular  Brief/Interim Summary: Claire Bradley  is a 45 y.o. female, History of ureteral  stone, pyelonephritis who came to the hospital with left upper quadrant pain for last 3 days. Pain becomes worse on deep breathing, also complains of shortness of breath. Patient has been coughing up phlegm, usually clear. Borderline coming to the ED patient developed nausea and vomiting. Also complains of diarrhea for the past 2 days. She denies dysuria urgency or frequency of urination. Positive history of fever. In the ED CT angiogram chest was done as patient had elevated d-dimer, CTA was negative for PE. Showed left greater than right small pleural effusion, patchy lingular opacity could also represent atelectasis or localized infection.  Patient reported significant improvement in her symptoms at day of discharge.  She was however, noted to have slightly elevated LFT's and calcium in the urine.  Patient states she has a history of kidney stones for which she was encouraged to increase her PO fluid intake.  Patient does not have a gallbladder and so she was encouraged to follow up with a repeat blood test to evaluate her liver enzymes at the health department to ensure resolution of her liver function tests.  Discharge Diagnoses:  Active Problems:   Leukocytosis   CAP (community acquired pneumonia)    Discharge Instructions  Discharge Instructions    Call MD for:  difficulty  breathing, headache or visual disturbances    Complete by:  As directed    Call MD for:  extreme fatigue    Complete by:  As directed    Call MD for:  temperature >100.4    Complete by:  As directed    Diet general    Complete by:  As directed    Increase activity slowly    Complete by:  As directed        Medication List    TAKE these medications   azithromycin 500 MG tablet Commonly known as:  ZITHROMAX Take 1 tablet (500 mg total) by mouth daily.   cefUROXime 500 MG tablet Commonly known as:  CEFTIN Take 1 tablet (500 mg total) by mouth 2 (two) times daily with a meal.       Allergies  Allergen Reactions  . Penicillins Nausea And Vomiting    SEVERE    Consultations:  None   Procedures/Studies: Dg Chest 2 View  Result Date: 12/26/2015 CLINICAL DATA:  Three-day history of chest pain and shortness of breath EXAM: CHEST  2 VIEW COMPARISON:  February 21, 2013 FINDINGS: There is no edema or consolidation. The heart size and pulmonary vascularity are normal. No adenopathy. There is mild degenerative change in the thoracic spine. IMPRESSION: No edema or consolidation. Electronically Signed   By: Bretta BangWilliam  Woodruff III M.D.   On: 12/26/2015 16:23   Ct Angio Chest Pe W And/or Wo Contrast  Result Date: 12/26/2015 CLINICAL DATA:  Left upper quadrant abdominal pain starting 3 days ago. Vomiting today. EXAM: CT ANGIOGRAPHY CHEST WITH CONTRAST TECHNIQUE: Multidetector CT imaging of the chest was performed using the standard  protocol during bolus administration of intravenous contrast. Multiplanar CT image reconstructions and MIPs were obtained to evaluate the vascular anatomy. CONTRAST:  100 cc of Isovue 370 COMPARISON:  Chest radiograph of earlier today.  No prior CT. FINDINGS: Cardiovascular: The quality of this exam for evaluation of pulmonary embolism is moderate to good. Bolus is suboptimally timed, with contrast centered in the SVC. No evidence of pulmonary embolism. Aberrant  right subclavian artery, traversing posterior to the esophagus. Otherwise normal thoracic aorta. Normal heart size, without pericardial effusion. Mediastinum/Nodes: No mediastinal or hilar adenopathy. Minimal residual thymus in the anterior mediastinum. Lungs/Pleura: Small left pleural effusion. Trace right pleural fluid. Minimal susceptibility artifact from EKG leads. Minimal centrilobular emphysema. Dependent lingular and left greater than right subsegmental airspace opacities. Upper Abdomen: Normal imaged portions of the liver, spleen, stomach, pancreas, right adrenal gland, kidneys. Left adrenal thickening with maintenance of adreniform shape. Musculoskeletal: Mild accentuation of expected thoracic kyphosis. Review of the MIP images confirms the above findings. IMPRESSION: 1. No evidence of pulmonary embolism. Mildly degraded by suboptimal bolus timing. 2. Left greater than right small pleural effusions. Bibasilar dependent atelectasis. Patchy lingular opacity could also represent atelectasis or localized infection. 3. 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 Electronically Signed   By: Jeronimo Greaves M.D.   On: 12/26/2015 19:26       Subjective: Patient seen and evaluated.  Family of husband and two children are bedside.  She voices she feels well today.  Is wondering if she could go home.  She mentions that she ate well this morning and had her husband bring her [employer].  She [facility] for [facility].  She was encouraged to follow up with them at soonest available appointment.  She denies chest pain, chest pressure, shortness of breath, increased work of breathing, dysuria, hematuria, abdominal pain, diarrhea.  Discharge Exam: Vitals:   12/26/15 2015 12/27/15 0557  BP:  122/80  Pulse: 79 89  Resp: 15 18  Temp:  98.6 F (37 C)   Vitals:   12/26/15 1930 12/26/15 2000 12/26/15 2015 12/27/15 0557  BP: 121/74   122/80  Pulse: 80 84 79 89  Resp:  19 21 15 18   Temp:    98.6 F (37 C)  TempSrc:    Oral  SpO2: 95% 94% 95% 94%  Weight:   94.5 kg (208 lb 4.8 oz)   Height:   5\' 5"  (1.651 m)     General: Pt is alert, awake, not in acute distress Cardiovascular: RRR, S1/S2 +, no rubs, no gallops Respiratory: no wheezing, some rales in the left lower lung base, good aeration and normal respiratory effort Abdominal: Soft, NT, ND, bowel sounds + Extremities: no edema, no cyanosis    The results of significant diagnostics from this hospitalization (including imaging, microbiology, ancillary and laboratory) are listed below for reference.     Microbiology: Recent Results (from the past 240 hour(s))  Culture, blood (routine x 2) Call MD if unable to obtain prior to antibiotics being given     Status: None (Preliminary result)   Collection Time: 12/26/15  9:06 PM  Result Value Ref Range Status   Specimen Description BLOOD LEFT ARM  Final   Special Requests BOTTLES DRAWN AEROBIC AND ANAEROBIC 6CC  Final   Culture NO GROWTH < 12 HOURS  Final   Report Status PENDING  Incomplete  Culture, blood (routine x 2) Call MD if unable to obtain prior to antibiotics being given  Status: None (Preliminary result)   Collection Time: 12/26/15  9:09 PM  Result Value Ref Range Status   Specimen Description BLOOD LEFT HAND  Final   Special Requests BOTTLES DRAWN AEROBIC AND ANAEROBIC 6CC  Final   Culture NO GROWTH < 12 HOURS  Final   Report Status PENDING  Incomplete     Labs: BNP (last 3 results) No results for input(s): BNP in the last 8760 hours. Basic Metabolic Panel:  Recent Labs Lab 12/26/15 1423 12/27/15 0634  NA 137 135  K 3.5 3.5  CL 103 104  CO2 25 25  GLUCOSE 95 90  BUN 6 5*  CREATININE 0.59 0.47  CALCIUM 9.4 8.3*   Liver Function Tests:  Recent Labs Lab 12/26/15 1423 12/27/15 0634  AST 15 82*  ALT 13* 78*  ALKPHOS 60 69  BILITOT 0.6 0.6  PROT 7.8 6.3*  ALBUMIN 4.4 3.5    Recent Labs Lab 12/26/15 1423   LIPASE 17   No results for input(s): AMMONIA in the last 168 hours. CBC:  Recent Labs Lab 12/26/15 1423 12/27/15 0634  WBC 20.1* 14.1*  HGB 14.0 11.9*  HCT 41.5 36.0  MCV 96.5 98.1  PLT 310 265   Cardiac Enzymes:  Recent Labs Lab 12/26/15 1423  TROPONINI <0.03   BNP: Invalid input(s): POCBNP CBG: No results for input(s): GLUCAP in the last 168 hours. D-Dimer  Recent Labs  12/26/15 1423  DDIMER 0.78*   Hgb A1c No results for input(s): HGBA1C in the last 72 hours. Lipid Profile No results for input(s): CHOL, HDL, LDLCALC, TRIG, CHOLHDL, LDLDIRECT in the last 72 hours. Thyroid function studies No results for input(s): TSH, T4TOTAL, T3FREE, THYROIDAB in the last 72 hours.  Invalid input(s): FREET3 Anemia work up No results for input(s): VITAMINB12, FOLATE, FERRITIN, TIBC, IRON, RETICCTPCT in the last 72 hours. Urinalysis    Component Value Date/Time   COLORURINE YELLOW 12/26/2015 1343   APPEARANCEUR CLEAR 12/26/2015 1343   LABSPEC 1.020 12/26/2015 1343   PHURINE 6.0 12/26/2015 1343   GLUCOSEU NEGATIVE 12/26/2015 1343   HGBUR SMALL (A) 12/26/2015 1343   BILIRUBINUR NEGATIVE 12/26/2015 1343   KETONESUR NEGATIVE 12/26/2015 1343   PROTEINUR 100 (A) 12/26/2015 1343   UROBILINOGEN 0.2 08/29/2014 1454   NITRITE NEGATIVE 12/26/2015 1343   LEUKOCYTESUR NEGATIVE 12/26/2015 1343   Sepsis Labs Invalid input(s): PROCALCITONIN,  WBC,  LACTICIDVEN Microbiology Recent Results (from the past 240 hour(s))  Culture, blood (routine x 2) Call MD if unable to obtain prior to antibiotics being given     Status: None (Preliminary result)   Collection Time: 12/26/15  9:06 PM  Result Value Ref Range Status   Specimen Description BLOOD LEFT ARM  Final   Special Requests BOTTLES DRAWN AEROBIC AND ANAEROBIC 6CC  Final   Culture NO GROWTH < 12 HOURS  Final   Report Status PENDING  Incomplete  Culture, blood (routine x 2) Call MD if unable to obtain prior to antibiotics being  given     Status: None (Preliminary result)   Collection Time: 12/26/15  9:09 PM  Result Value Ref Range Status   Specimen Description BLOOD LEFT HAND  Final   Special Requests BOTTLES DRAWN AEROBIC AND ANAEROBIC 6CC  Final   Culture NO GROWTH < 12 HOURS  Final   Report Status PENDING  Incomplete     Time coordinating discharge:  Less than 30 minutes  SIGNED:   Bennett Scrape, MD  [facility] 12/27/2015, 12:11 PM Pager [phone] If  7PM-7AM, please contact night-coverage [email or web address] Password [email or web address]

## 2015-12-28 LAB — HIV ANTIBODY (ROUTINE TESTING W REFLEX): HIV SCREEN 4TH GENERATION: NONREACTIVE

## 2015-12-31 LAB — CULTURE, BLOOD (ROUTINE X 2)
CULTURE: NO GROWTH
Culture: NO GROWTH

## 2016-05-13 ENCOUNTER — Encounter (HOSPITAL_COMMUNITY): Payer: Self-pay | Admitting: *Deleted

## 2016-05-13 ENCOUNTER — Emergency Department (HOSPITAL_COMMUNITY)
Admission: EM | Admit: 2016-05-13 | Discharge: 2016-05-13 | Disposition: A | Discharge status: Home / Self Care | Payer: PRIVATE HEALTH INSURANCE | Attending: Emergency Medicine | Admitting: Emergency Medicine

## 2016-05-13 DIAGNOSIS — F17210 Nicotine dependence, cigarettes, uncomplicated: Secondary | ICD-10-CM | POA: Insufficient documentation

## 2016-05-13 DIAGNOSIS — H1032 Unspecified acute conjunctivitis, left eye: Secondary | ICD-10-CM | POA: Diagnosis not present

## 2016-05-13 DIAGNOSIS — H5712 Ocular pain, left eye: Secondary | ICD-10-CM | POA: Diagnosis present

## 2016-05-13 MED ORDER — TOBRAMYCIN 0.3 % OP SOLN
1.0000 [drp] | Freq: Once | OPHTHALMIC | Status: AC
  Administered 2016-05-13: 1 [drp] via OPHTHALMIC
  Filled 2016-05-13: qty 5

## 2016-05-13 NOTE — ED Provider Notes (Signed)
[facility] Provider Note   CSN: [id number] Arrival date & time: 05/13/16  1022     History   Chief Complaint Chief Complaint  Patient presents with  . Eye Pain    HPI Claire Bradley is a 46 y.o. female presenting with eye pain, redness and drainage.  She endorses having right eye pain, redness and swelling with clear to yellow drainage Started approximately 10 days ago, cleared but now it's affecting the left eye.  She woke this morning with inability to open the left eye due to dried eyelash matting.  She has used warm compresses to keep her eye clean and has also applied triple antibiotic ointment to her upper eyelid as it was swollen and irritated.  She has had no relief with this treatment.  She denies visual changes, although endorses blurred vision secondary to drainage.  She has found no alleviators.  Patient does not wear contacts.  She does have eyeglasses but is not wearing currently.     The history is provided by the patient.    Past Medical History:  Diagnosis Date  . Complication of anesthesia    HARD TO WAKE  . Frequency of urination   . Hematuria   . History of PID   . History of sepsis    UROSEPSIS / PYELONEPHRITIS--   03-25-2014  . Left ureteral calculus   . Urgency of urination     Patient Active Problem List   Diagnosis Date Noted  . CAP (community acquired pneumonia) 12/26/2015  . Flank pain   . Pyelonephritis 03/22/2014  . Acute urethral obstruction 03/22/2014  . Urethral stone 03/22/2014  . Tachycardia 03/22/2014  . Leukocytosis 03/22/2014  . Hypokalemia 03/22/2014  . Sepsis (HCC) 03/22/2014    Past Surgical History:  Procedure Laterality Date  . ABDOMINAL HYSTERECTOMY  02/  2015  . CESAREAN SECTION  03-11-2003  . CHOLECYSTECTOMY N/A 08/18/2012   Procedure: LAPAROSCOPIC CHOLECYSTECTOMY;  Surgeon: Fabio BeringBrent C Ziegler, MD;  Location: [facility];  Service: General;  Laterality: N/A;  . CYSTOSCOPY W/ URETERAL STENT REMOVAL Left 04/11/2014   Procedure: CYSTOSCOPY WITH STENT REMOVAL;  Surgeon: Anner CreteJohn J Wrenn, MD;  [facility] [place];  Service: Urology;  Laterality: Left;  . CYSTOSCOPY WITH STENT PLACEMENT Left 03/22/2014   Procedure: CYSTOSCOPY WITH STENT PLACEMENT;  Surgeon: Anner CreteJohn J Wrenn, MD;  Location: [facility];  Service: Urology;  Laterality: Left;  . CYSTOSCOPY/RETROGRADE/URETEROSCOPY/STONE EXTRACTION WITH BASKET Left 04/11/2014   Procedure: LEFT URETEROSCOPY STONE EXTRACTION WITH BASKET;  Surgeon: Anner CreteJohn J Wrenn, MD;  [facility] [place];  Service: Urology;  Laterality: Left;  . SHOULDER ARTHROSCOPY WITH ROTATOR CUFF REPAIR Left 2000  . TUBAL LIGATION  2006 (approx)    OB History    No data available       Home Medications    Prior to Admission medications   Not on File    Family History No family history on file.  Social History Social History  Substance Use Topics  . Smoking status: Current Every Day Smoker    Packs/day: 0.50    Years: 25.00    Types: Cigarettes  . Smokeless tobacco: Never Used  . Alcohol use No     Allergies   Penicillins   Review of Systems Review of Systems  Constitutional: Negative for chills and fever.  HENT: Negative for congestion, ear pain, rhinorrhea, sinus pressure, sore throat, trouble swallowing and voice change.   Eyes: Positive for pain, discharge and redness.  Physical Exam Updated Vital Signs BP 134/89   Pulse 89   Temp 98.1 F (36.7 C) (Oral)   Resp 16   Ht 5\' 9"  (1.753 m)   Wt 90.7 kg   LMP  (LMP Unknown)   SpO2 98%   BMI 29.53 kg/m   Physical Exam  Constitutional: She is oriented to person, place, and time. She appears well-developed and well-nourished.  HENT:  Head: Normocephalic and atraumatic.  Right Ear: Tympanic membrane and ear canal normal.  Left Ear: Tympanic membrane and ear canal normal.  Nose: No mucosal edema or rhinorrhea.  Mouth/Throat: Uvula is midline, oropharynx is clear and moist and mucous  membranes are normal. No oropharyngeal exudate, posterior oropharyngeal edema, posterior oropharyngeal erythema or tonsillar abscesses.  Eyes: EOM are normal. Pupils are equal, round, and reactive to light. Left eye exhibits discharge. Left eye exhibits no chemosis. Left conjunctiva is injected.  Visual Acuity  R Distance: 20/50 L Distance: 20/100  Left upper eyelid with boggy edema.   Musculoskeletal: Normal range of motion.  Neurological: She is alert and oriented to person, place, and time.  Skin: Skin is warm and dry. No rash noted.  Psychiatric: She has a normal mood and affect.     ED Treatments / Results  Labs (all labs ordered are listed, but only abnormal results are displayed) Labs Reviewed - No data to display  EKG  EKG Interpretation None       Radiology No results found.  Procedures Procedures (including critical care time)  Medications Ordered in ED Medications  tobramycin (TOBREX) 0.3 % ophthalmic solution 1 drop (1 drop Both Eyes Given 05/13/16 1220)     Initial Impression / Assessment and Plan / ED Course  I have reviewed the triage vital signs and the nursing notes.  Pertinent labs & imaging results that were available during my care of the patient were reviewed by me and considered in my medical decision making (see chart for details).     tobrex given.  Warm compresses, avoid rubbing eyes, wash hands frequently.  Exam c/w probable viral conjunctivitis.  No pain or sx to suggest corneal process or infection.  Plan recheck if not improved with tx.    Final Clinical Impressions(s) / ED Diagnoses   Final diagnoses:  Acute conjunctivitis of left eye, unspecified acute conjunctivitis type    New Prescriptions There are no discharge medications for this patient.    Burgess Amor, PA-C 05/13/16 1342    Benjiman Core, MD 05/13/16 [phone]

## 2016-05-13 NOTE — ED Triage Notes (Signed)
Pt c/o left eye pain that started a couple weeks ago. Pt reports the pain and swelling will start in the right eye and then the next day it's in the left eye. Pt reports blurry vision, itching pain, drainage. Pt's left eyelid is red and swollen.

## 2016-05-13 NOTE — ED Notes (Signed)
PA at bedside.

## 2016-05-13 NOTE — Discharge Instructions (Signed)
Apply 1 drop of the antibiotic given in each eye every 3 hours while awake for the next 7 days. Use warm compresses as you are doing for comfort and to help clear the drainage.  Avoid rubbing your eyes which can make the eyelid swelling worse.  Get rechecked if your symptoms are not improved with this treatment.

## 2016-06-03 IMAGING — CT CT HEAD W/O CM
1 series · 16 of 30 positions shown, 20 images · non-contrast
Comparison: None.

CLINICAL DATA: Headache and fever

EXAM:
CT HEAD WITHOUT CONTRAST
TECHNIQUE: Contiguous axial images were obtained from the base of the skull
through the vertex without intravenous contrast.

[Series 2: headseq 4.8 h37s · axial · 0.46mm/px · z∈[+87,+245]mm · 16 of 36 slices shown, 20 images]
[im 2/36  brain]
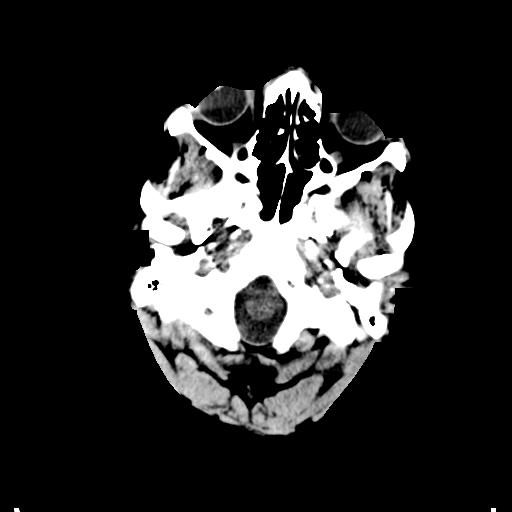
[im 2/36  bone]
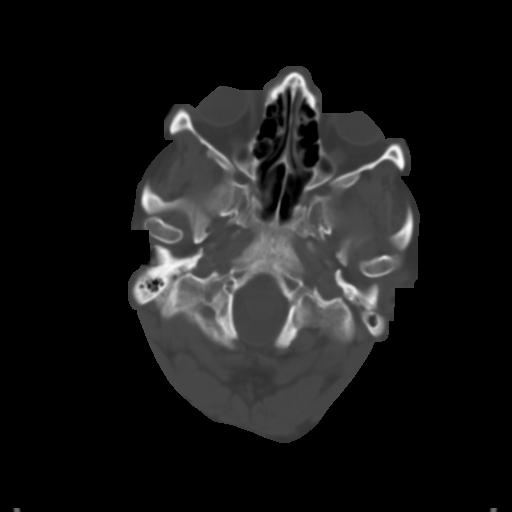
[im 4/36  brain]
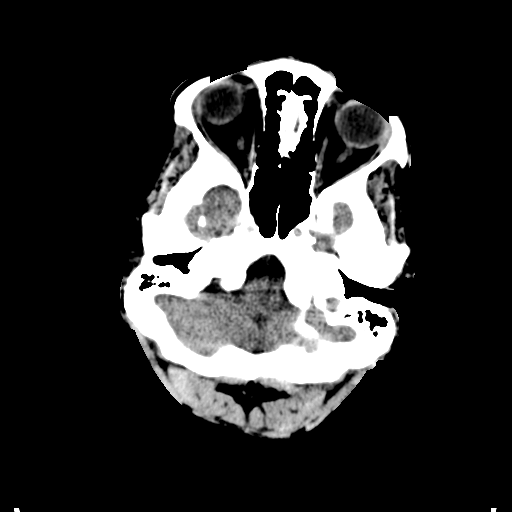
[im 7/36  brain]
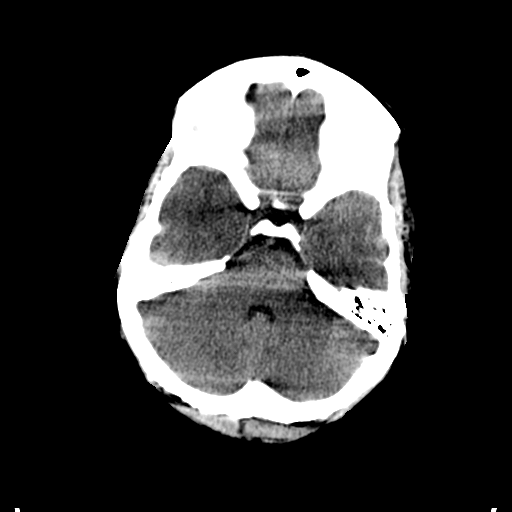
[im 9/36  brain]
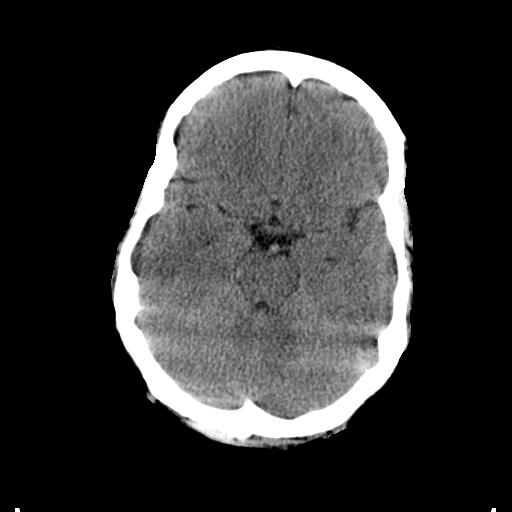
[im 10/36  brain]
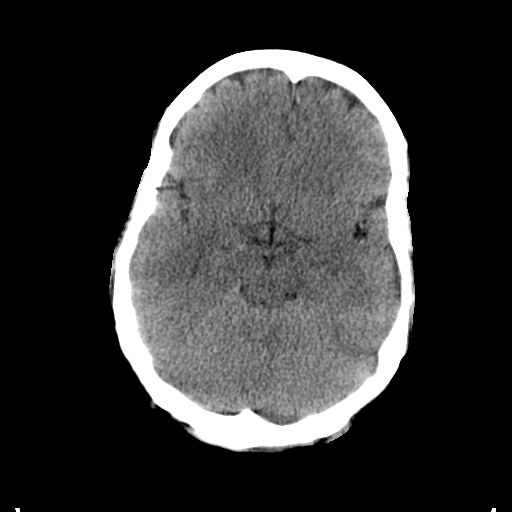
[im 10/36  bone]
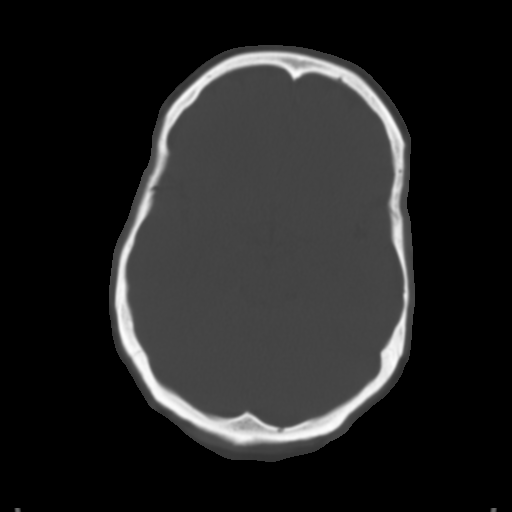
[im 13/36  brain]
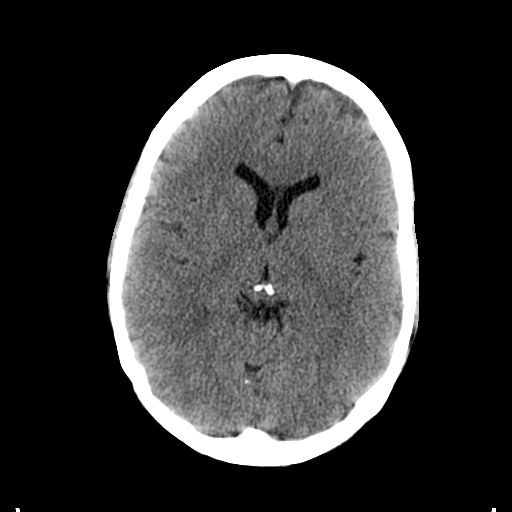
[im 15/36  brain]
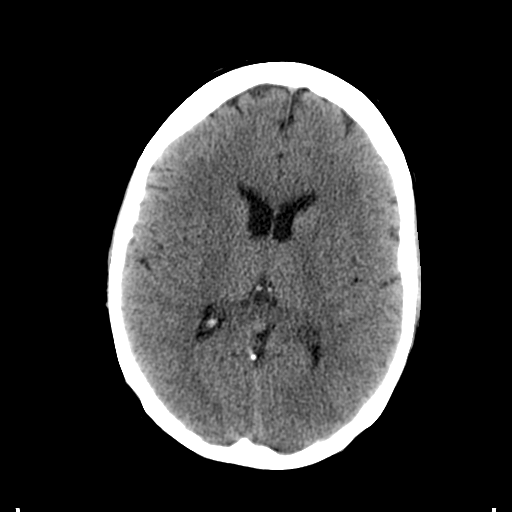
[im 17/36  brain]
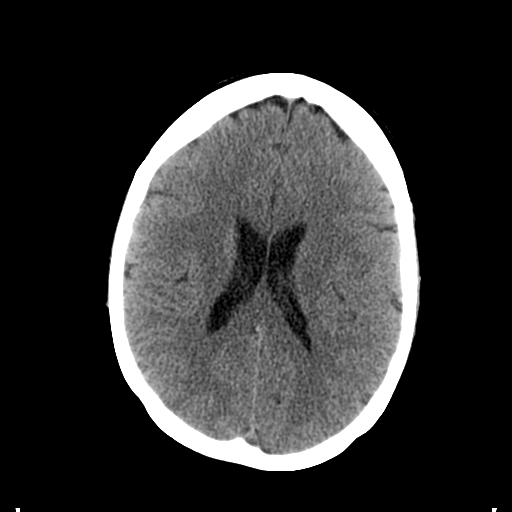
[im 19/36  brain]
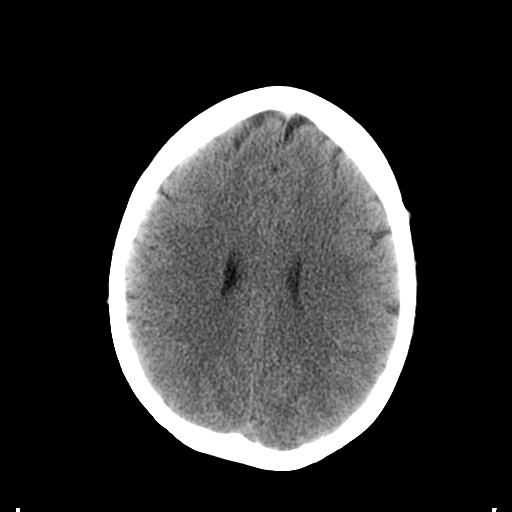
[im 19/36  bone]
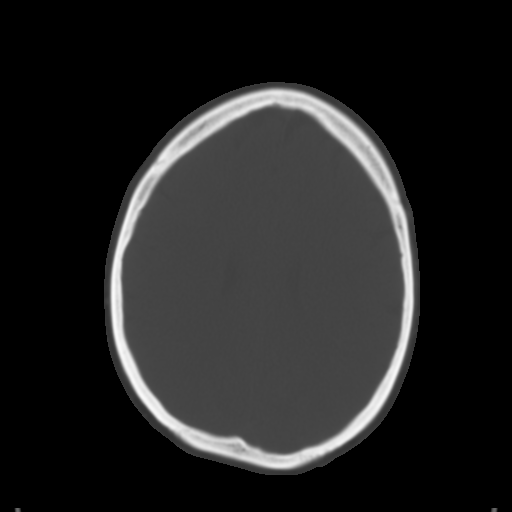
[im 21/36  brain]
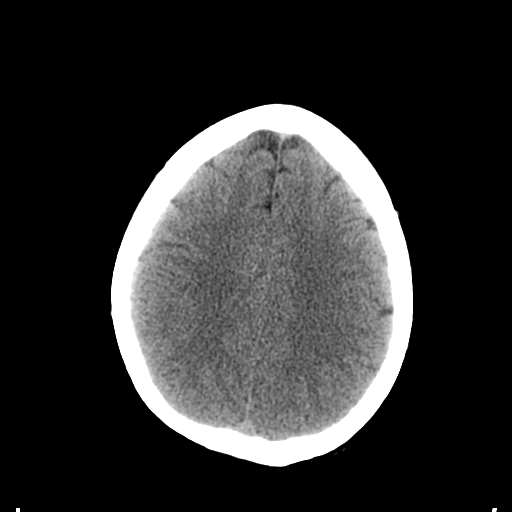
[im 23/36  brain]
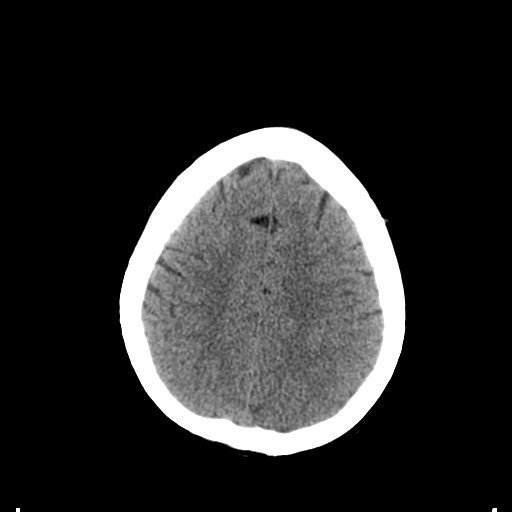
[im 26/36  brain]
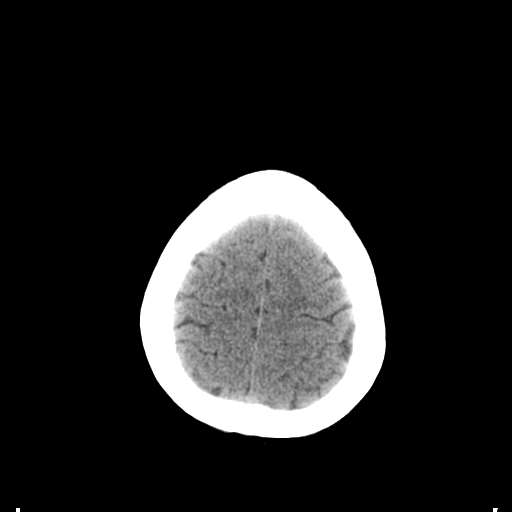
[im 27/36  brain]
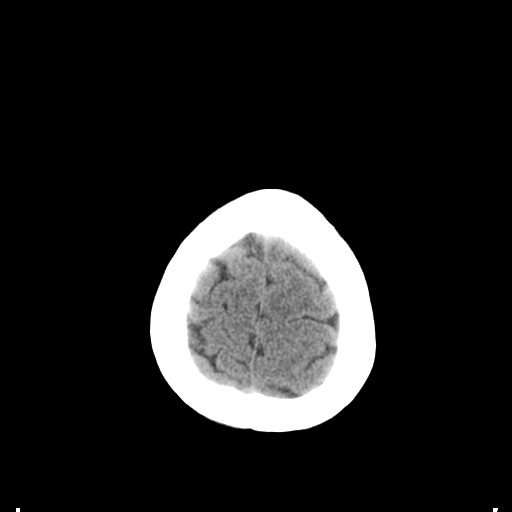
[im 27/36  bone]
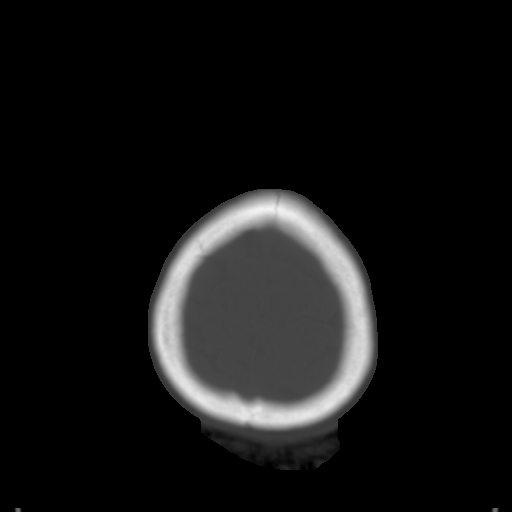
[im 29/36  brain]
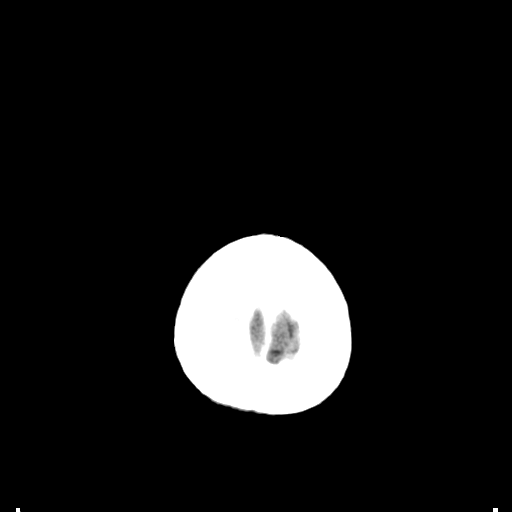
[im 32/36  brain]
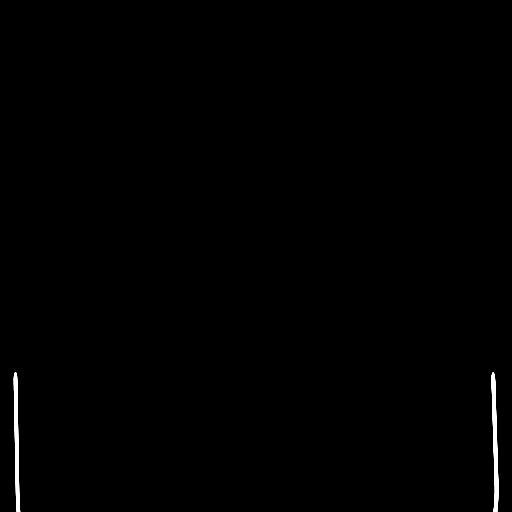
[im 34/36  brain]
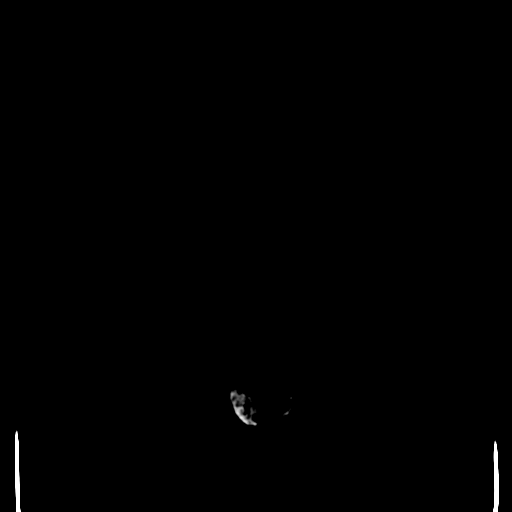

[16 of 30 positions shown; findings below may reference images not displayed]

FINDINGS: The ventricles are normal in size and configuration. There is no
mass, hemorrhage, extra-axial fluid collection, or midline shift.
Gray-white compartments are normal. No acute infarct apparent. Bony
calvarium appears intact. The mastoid air cells are clear. There is
debris in each external auditory canal.
IMPRESSION: Suspect cerumen in each external auditory canal. No intracranial
mass, hemorrhage, or focal gray -white compartment lesions/acute
appearing infarct.

## 2016-06-03 IMAGING — CT CT RENAL STONE PROTOCOL
3 of 4 series · 9 of 46 positions shown, 16 images · non-contrast
Comparison: 07/28/2012

CLINICAL DATA: Left flank pain since 3 a.m. today.  Vomiting.

EXAM:
CT ABDOMEN AND PELVIS WITHOUT CONTRAST
TECHNIQUE: Multidetector CT imaging of the abdomen and pelvis was performed
following the standard protocol without IV contrast.

[Series 3: mpr coronal 3.0mm · coronal · 0.71mm/px · 3 of 102 slices shown, 4 images]
[im 34/102  soft-tissue]
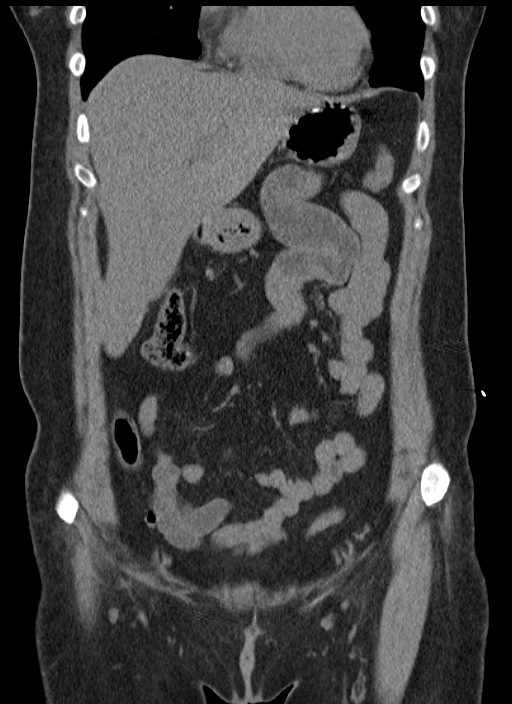
[im 45/102  soft-tissue]
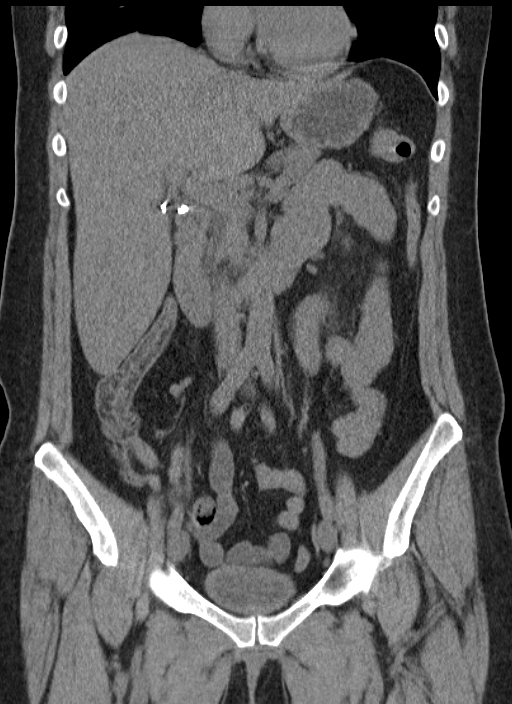
[im 45/102  bone]
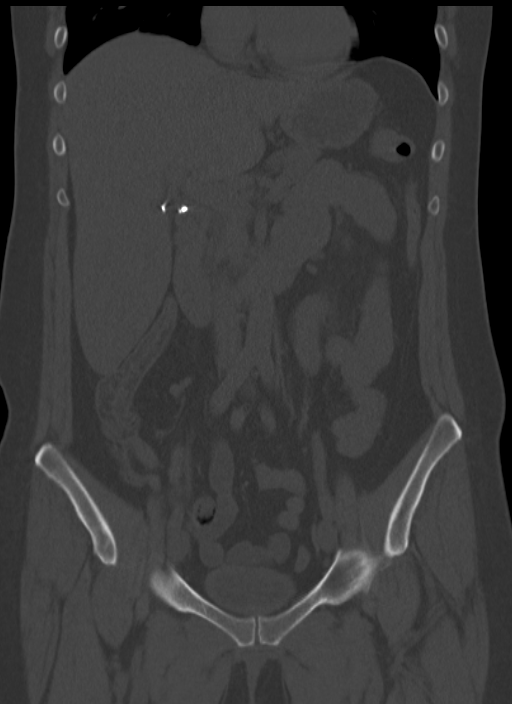
[im 57/102  soft-tissue]
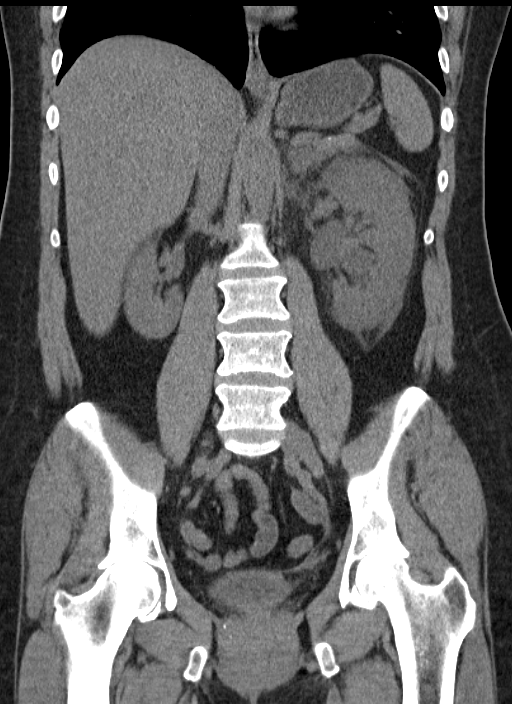

[Series 4: mpr sagittal 3.0mm · sagittal · 0.60mm/px · 1 of 115 slices shown, 2 images]
[im 39/115  soft-tissue]
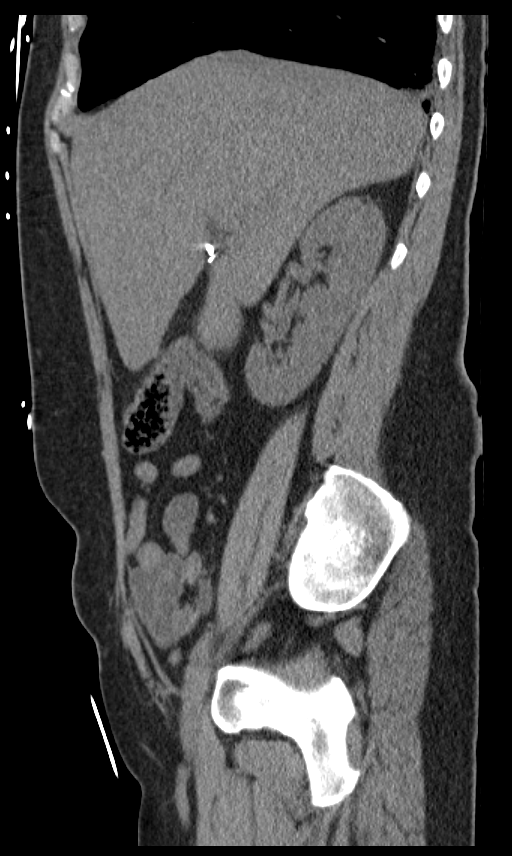
[im 39/115  bone]
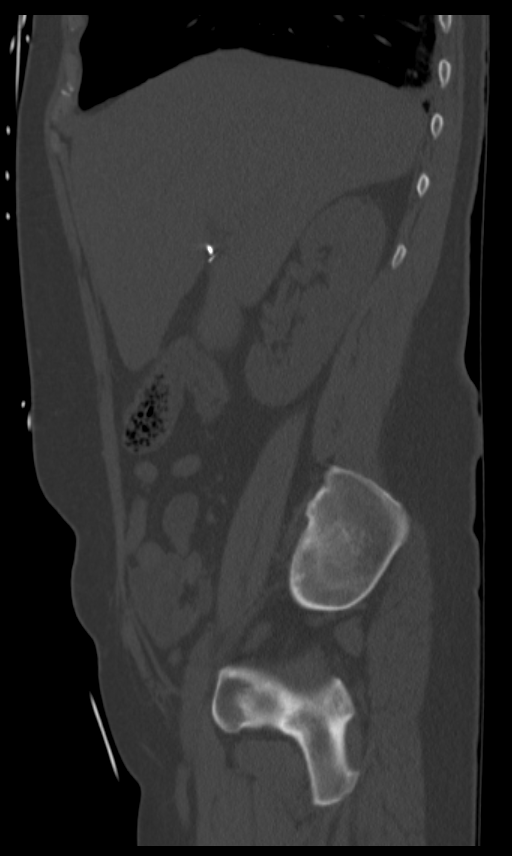

[Series 6: lung 5.0 b60f · axial · 0.75mm/px · z∈[-109,-14]mm · 5 of 29 slices shown, 10 images]
[im 5/29  soft-tissue]
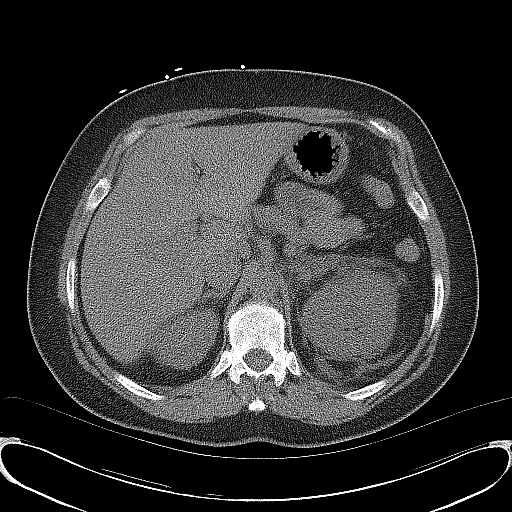
[im 5/29  bone]
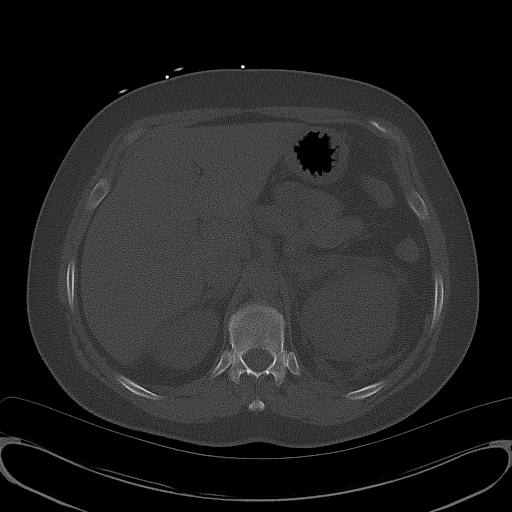
[im 10/29  soft-tissue]
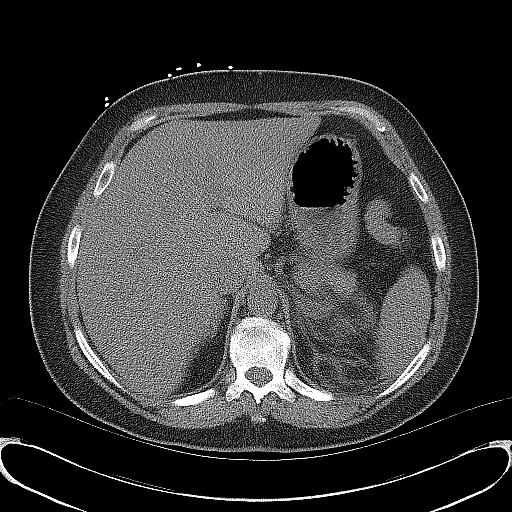
[im 10/29  lung]
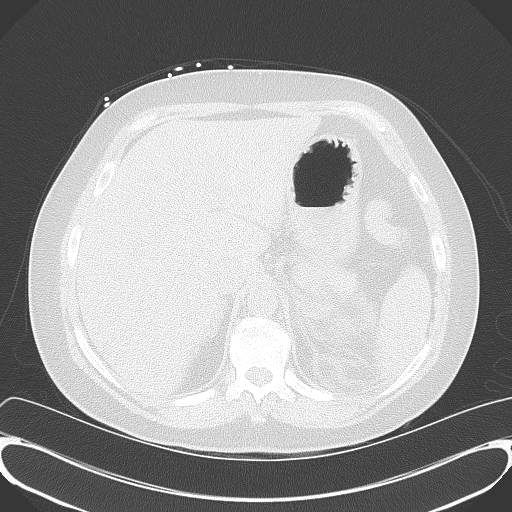
[im 15/29  soft-tissue]
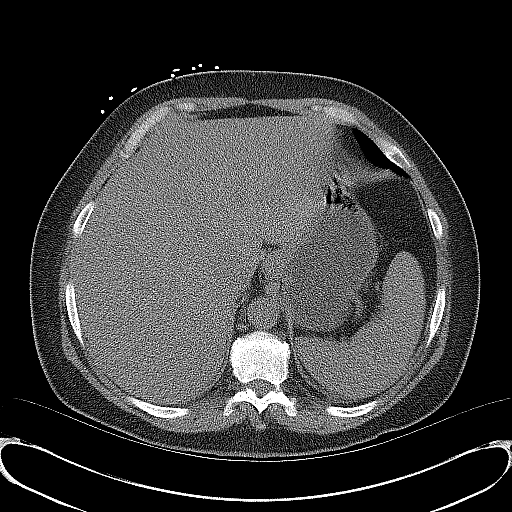
[im 15/29  lung]
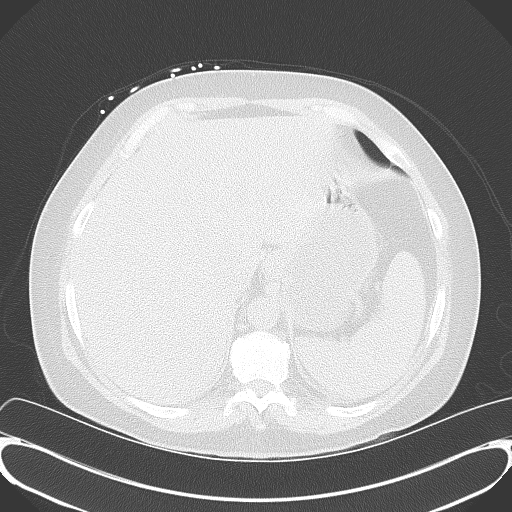
[im 19/29  soft-tissue]
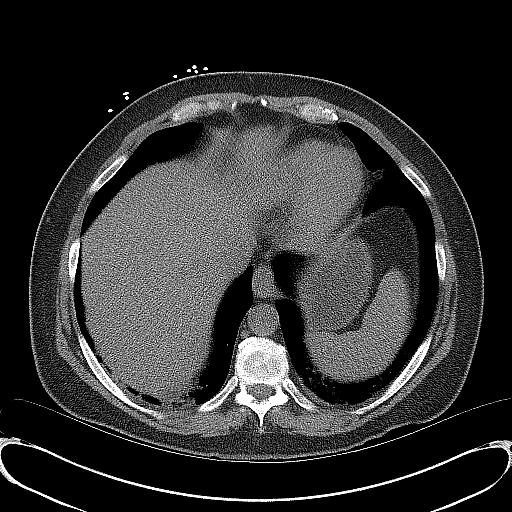
[im 19/29  lung]
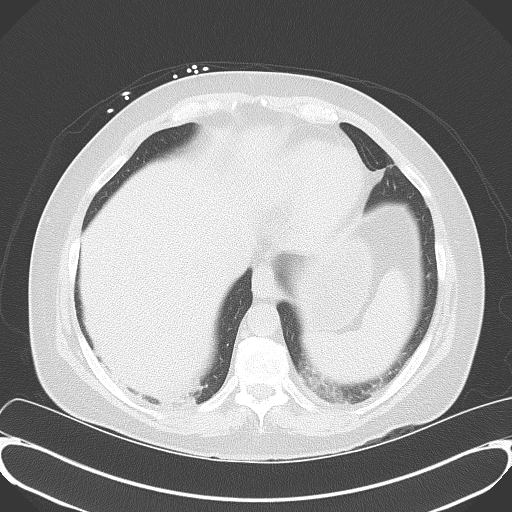
[im 24/29  soft-tissue]
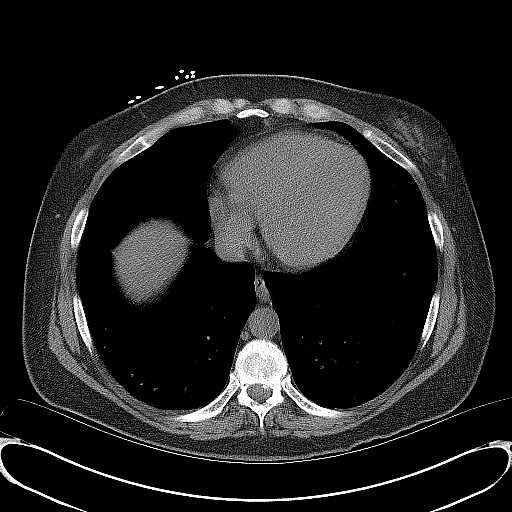
[im 24/29  lung]
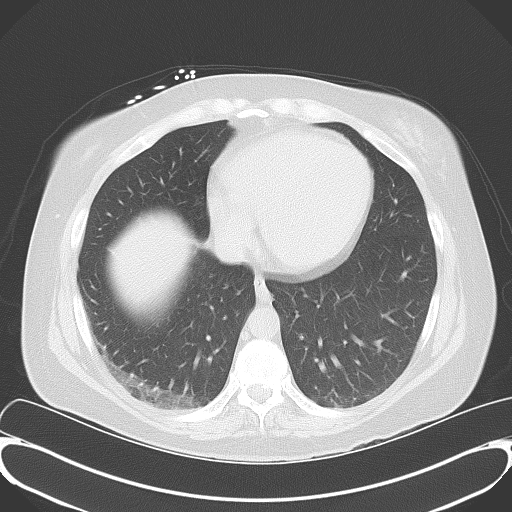

[9 of 46 positions shown; findings below may reference images not displayed]

FINDINGS: There is bibasilar dependent atelectasis. No effusions. Heart is
normal size.

Moderate left hydronephrosis. Extensive left perinephric stranding.
5 mm proximal left ureteral stone. No additional ureteral stones. No
renal stones. No hydronephrosis on the right.

Prior cholecystectomy. Liver, spleen, pancreas, adrenals have an
unremarkable unenhanced appearance. Appendix is visualized and is
normal. Stomach, large and small bowel are unremarkable. No free
fluid, free air or adenopathy. Aorta is normal caliber.

No acute bony abnormality or focal bone lesion.
IMPRESSION: 5 mm proximal left ureteral stone with moderate hydronephrosis and
extensive perinephric stranding.

## 2016-07-16 ENCOUNTER — Ambulatory Visit (INDEPENDENT_AMBULATORY_CARE_PROVIDER_SITE_OTHER): Payer: PRIVATE HEALTH INSURANCE | Admitting: Pediatrics

## 2016-07-16 ENCOUNTER — Telehealth: Payer: Self-pay | Admitting: Pediatrics

## 2016-07-16 ENCOUNTER — Encounter: Payer: Self-pay | Admitting: Pediatrics

## 2016-07-16 VITALS — BP 130/87 | HR 82 | Temp 97.6°F | Resp 20 | Ht 69.0 in | Wt 219.8 lb

## 2016-07-16 DIAGNOSIS — R062 Wheezing: Secondary | ICD-10-CM

## 2016-07-16 DIAGNOSIS — Z6832 Body mass index (BMI) 32.0-32.9, adult: Secondary | ICD-10-CM

## 2016-07-16 DIAGNOSIS — R740 Nonspecific elevation of levels of transaminase and lactic acid dehydrogenase [LDH]: Secondary | ICD-10-CM

## 2016-07-16 DIAGNOSIS — D649 Anemia, unspecified: Secondary | ICD-10-CM

## 2016-07-16 DIAGNOSIS — R5383 Other fatigue: Secondary | ICD-10-CM | POA: Diagnosis not present

## 2016-07-16 DIAGNOSIS — Z720 Tobacco use: Secondary | ICD-10-CM | POA: Diagnosis not present

## 2016-07-16 DIAGNOSIS — F1290 Cannabis use, unspecified, uncomplicated: Secondary | ICD-10-CM

## 2016-07-16 DIAGNOSIS — R7401 Elevation of levels of liver transaminase levels: Secondary | ICD-10-CM | POA: Insufficient documentation

## 2016-07-16 MED ORDER — NICOTINE 7 MG/24HR TD PT24: 7.0000 mg | MEDICATED_PATCH | Freq: Every day | TRANSDERMAL | 0 refills | Status: AC

## 2016-07-16 MED ORDER — ALBUTEROL SULFATE HFA 108 (90 BASE) MCG/ACT IN AERS: 2.0000 | INHALATION_SPRAY | Freq: Four times a day (QID) | RESPIRATORY_TRACT | 2 refills | Status: DC | PRN

## 2016-07-16 MED ORDER — ALBUTEROL SULFATE (2.5 MG/3ML) 0.083% IN NEBU: 2.5000 mg | INHALATION_SOLUTION | Freq: Four times a day (QID) | RESPIRATORY_TRACT | 1 refills | Status: AC | PRN

## 2016-07-16 NOTE — Patient Instructions (Signed)
If any nasal congestion, start flonase nasal steroid spray, antihistamine such as cetirizine/zyrtec

## 2016-07-16 NOTE — Telephone Encounter (Signed)
Patient aware that Rx for nebulizer machine is ready for pick up here at the office.
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
## 2016-07-17 LAB — CMP14+EGFR
ALT: 10 IU/L (ref 0–32)
AST: 14 IU/L (ref 0–40)
Albumin/Globulin Ratio: 1.6 (ref 1.2–2.2)
Albumin: 4.1 g/dL (ref 3.5–5.5)
Alkaline Phosphatase: 57 IU/L (ref 39–117)
BILIRUBIN TOTAL: 0.3 mg/dL (ref 0.0–1.2)
BUN/Creatinine Ratio: 13 (ref 9–23)
BUN: 8 mg/dL (ref 6–24)
CALCIUM: 9.5 mg/dL (ref 8.7–10.2)
CO2: 25 mmol/L (ref 18–29)
CREATININE: 0.62 mg/dL (ref 0.57–1.00)
Chloride: 101 mmol/L (ref 96–106)
GFR, EST AFRICAN AMERICAN: 127 mL/min/{1.73_m2} (ref >59)
GFR, EST NON AFRICAN AMERICAN: 110 mL/min/{1.73_m2} (ref >59)
GLUCOSE: 91 mg/dL (ref 65–99)
Globulin, Total: 2.6 g/dL (ref 1.5–4.5)
Potassium: 4.5 mmol/L (ref 3.5–5.2)
Sodium: 139 mmol/L (ref 134–144)
TOTAL PROTEIN: 6.7 g/dL (ref 6.0–8.5)

## 2016-07-17 LAB — CBC WITH DIFFERENTIAL/PLATELET
Basophils Absolute: 0 10*3/uL (ref 0.0–0.2)
Basos: 0 %
EOS (ABSOLUTE): 0.1 10*3/uL (ref 0.0–0.4)
EOS: 2 %
Hematocrit: 39 % (ref 34.0–46.6)
Hemoglobin: 12.9 g/dL (ref 11.1–15.9)
IMMATURE GRANULOCYTES: 0 %
Immature Grans (Abs): 0 10*3/uL (ref 0.0–0.1)
LYMPHS ABS: 3.6 10*3/uL — AB (ref 0.7–3.1)
LYMPHS: 38 %
MCH: 31.8 pg (ref 26.6–33.0)
MCHC: 33.1 g/dL (ref 31.5–35.7)
MCV: 96 fL (ref 79–97)
MONOCYTES: 8 %
Monocytes Absolute: 0.8 10*3/uL (ref 0.1–0.9)
NEUTROS ABS: 5 10*3/uL (ref 1.4–7.0)
Neutrophils: 52 %
PLATELETS: 335 10*3/uL (ref 150–379)
RBC: 4.06 x10E6/uL (ref 3.77–5.28)
RDW: 12.1 % — AB (ref 12.3–15.4)
WBC: 9.6 10*3/uL (ref 3.4–10.8)

## 2016-07-20 NOTE — Telephone Encounter (Signed)
Patient informed to contact [facility] to see if they have any nebulizers available

## 2016-10-15 ENCOUNTER — Ambulatory Visit: Payer: PRIVATE HEALTH INSURANCE | Admitting: Pediatrics

## 2016-10-18 ENCOUNTER — Telehealth: Payer: Self-pay | Admitting: Pediatrics

## 2016-10-18 ENCOUNTER — Encounter: Payer: Self-pay | Admitting: Pediatrics

## 2019-01-02 ENCOUNTER — Other Ambulatory Visit: Payer: Self-pay

## 2019-01-02 DIAGNOSIS — Z20822 Contact with and (suspected) exposure to covid-19: Secondary | ICD-10-CM

## 2019-01-03 LAB — NOVEL CORONAVIRUS, NAA: SARS-CoV-2, NAA: NOT DETECTED

## 2020-11-04 ENCOUNTER — Emergency Department (HOSPITAL_COMMUNITY)
Admission: EM | Admit: 2020-11-04 | Discharge: 2020-11-04 | Disposition: A | Discharge status: Home / Self Care | Payer: PRIVATE HEALTH INSURANCE | Attending: Emergency Medicine | Admitting: Emergency Medicine

## 2020-11-04 ENCOUNTER — Encounter (HOSPITAL_COMMUNITY): Payer: Self-pay

## 2020-11-04 ENCOUNTER — Other Ambulatory Visit: Payer: Self-pay

## 2020-11-04 DIAGNOSIS — F17210 Nicotine dependence, cigarettes, uncomplicated: Secondary | ICD-10-CM | POA: Insufficient documentation

## 2020-11-04 DIAGNOSIS — X500XXA Overexertion from strenuous movement or load, initial encounter: Secondary | ICD-10-CM | POA: Insufficient documentation

## 2020-11-04 DIAGNOSIS — S39012A Strain of muscle, fascia and tendon of lower back, initial encounter: Secondary | ICD-10-CM | POA: Insufficient documentation

## 2020-11-04 DIAGNOSIS — R062 Wheezing: Secondary | ICD-10-CM | POA: Insufficient documentation

## 2020-11-04 MED ORDER — HYDROCODONE-ACETAMINOPHEN 5-325 MG PO TABS
1.0000 | ORAL_TABLET | Freq: Once | ORAL | Status: AC
  Administered 2020-11-04: 1 via ORAL
  Filled 2020-11-04: qty 1

## 2020-11-04 MED ORDER — MELOXICAM 15 MG PO TABS: 15.0000 mg | ORAL_TABLET | Freq: Every day | ORAL | 0 refills | Status: AC

## 2020-11-04 MED ORDER — PREDNISONE 10 MG (21) PO TBPK: ORAL_TABLET | Freq: Every day | ORAL | 0 refills | Status: DC

## 2020-11-04 MED ORDER — METHOCARBAMOL 500 MG PO TABS: 500.0000 mg | ORAL_TABLET | Freq: Two times a day (BID) | ORAL | 0 refills | Status: AC

## 2020-11-04 MED ORDER — DEXAMETHASONE SODIUM PHOSPHATE 10 MG/ML IJ SOLN
10.0000 mg | Freq: Once | INTRAMUSCULAR | Status: AC
  Administered 2020-11-04: 10 mg via INTRAMUSCULAR
  Filled 2020-11-04: qty 1

## 2020-11-04 MED ORDER — ALBUTEROL SULFATE HFA 108 (90 BASE) MCG/ACT IN AERS
1.0000 | INHALATION_SPRAY | Freq: Once | RESPIRATORY_TRACT | Status: AC
  Administered 2020-11-04: 1 via RESPIRATORY_TRACT
  Filled 2020-11-04: qty 6.7

## 2020-11-04 MED ORDER — AEROCHAMBER PLUS FLO-VU MEDIUM MISC: 1.0000 | Freq: Once | Status: DC

## 2020-11-04 NOTE — ED Triage Notes (Signed)
Patient complaining of lower back pain since Sunday with nausea this am. Unaware of any injury. No radiation in pain, no urinary discomfort.

## 2020-11-04 NOTE — ED Provider Notes (Signed)
[facility] EMERGENCY DEPARTMENT Provider Note   CSN: [id number] Arrival date & time: 11/04/20  6195     History Chief Complaint  Patient presents with   Back Pain    Claire Bradley is a 50 y.o. female.  Pt presents to the ED today with low back pain.  Sx started on Sunday, 7/31.  She said it hurts when she moves.  She has tried [employer] without improvement in sx.  Pt denies any urinary sx.  She denies any radiation down her leg.  She is able to walk, but it does hurt when she walks.  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      Past Medical History:  Diagnosis Date   Complication of anesthesia    HARD TO WAKE   Frequency of urination    Hematuria    History of PID    History of sepsis    UROSEPSIS / PYELONEPHRITIS--   03-25-2014   Left ureteral calculus    Urgency of urination     Patient Active Problem List   Diagnosis Date Noted   Marijuana use 07/16/2016   BMI 32.0-32.9,adult 07/16/2016   Tobacco use 07/16/2016   Anemia 07/16/2016   Transaminitis 07/16/2016   Wheezing 07/16/2016   CAP (community acquired pneumonia) 12/26/2015   Flank pain    Pyelonephritis 03/22/2014   Acute urethral obstruction 03/22/2014   Urethral stone 03/22/2014   Tachycardia 03/22/2014   Leukocytosis 03/22/2014   Hypokalemia 03/22/2014   Sepsis (HCC) 03/22/2014    Past Surgical History:  Procedure Laterality Date   ABDOMINAL HYSTERECTOMY  02/  2015   CESAREAN SECTION  03-11-2003   CHOLECYSTECTOMY N/A 08/18/2012   Procedure: LAPAROSCOPIC CHOLECYSTECTOMY;  Surgeon: Fabio Bering, MD;  Location: [facility];  Service: General;  Laterality: N/A;   CYSTOSCOPY W/ URETERAL STENT REMOVAL Left 04/11/2014   Procedure: CYSTOSCOPY WITH STENT REMOVAL;  Surgeon: Anner Crete, MD;  Location: [facility];  Service: Urology;  Laterality: Left;   CYSTOSCOPY WITH STENT PLACEMENT Left 03/22/2014   Procedure: CYSTOSCOPY WITH STENT PLACEMENT;  Surgeon: Anner Crete, MD;  Location: [facility];  Service: Urology;  Laterality: Left;   CYSTOSCOPY/RETROGRADE/URETEROSCOPY/STONE EXTRACTION WITH BASKET Left 04/11/2014   Procedure: LEFT URETEROSCOPY STONE EXTRACTION WITH BASKET;  Surgeon: Anner Crete, MD;  [facility];  Service: Urology;  Laterality: Left;   SHOULDER ARTHROSCOPY WITH ROTATOR CUFF REPAIR Left 2000   TUBAL LIGATION  2006 (approx)     OB History   No obstetric history on file.     Family History  Problem Relation Age of Onset   Diabetes Mother    Cancer Mother    Heart disease Mother    Kidney disease Mother    Diabetes Brother    Multiple sclerosis Brother    Cancer Maternal Aunt    Cancer Maternal Grandmother    Heart disease Maternal Grandfather    Diabetes Brother     Social History   Tobacco Use   Smoking status: Every Day    Packs/day: 0.50    Years: 25.00    Pack years: 12.50    Types: Cigarettes   Smokeless tobacco: Never  Substance Use Topics   Alcohol use: No   Drug use: No    Home Medications Prior to Admission medications   Medication Sig Start Date End Date Taking? Authorizing Provider  meloxicam (MOBIC) 15 MG tablet  Take 1 tablet (15 mg total) by mouth daily. 11/04/20 12/04/20 Yes Jacalyn Lefevre, MD  methocarbamol (ROBAXIN) 500 MG tablet Take 1 tablet (500 mg total) by mouth 2 (two) times daily. 11/04/20  Yes Jacalyn Lefevre, MD  predniSONE (STERAPRED UNI-PAK 21 TAB) 10 MG (21) TBPK tablet Take by mouth daily. Take 6 tabs by mouth daily  for 2 days, then 5 tabs for 2 days, then 4 tabs for 2 days, then 3 tabs for 2 days, 2 tabs for 2 days, then 1 tab by mouth daily for 2 days 11/04/20  Yes Jacalyn Lefevre, MD  albuterol (PROVENTIL) (2.5 MG/3ML) 0.083% nebulizer solution Take 3 mLs (2.5 mg total) by nebulization every 6 (six) hours as needed for wheezing or shortness of breath. 07/16/16   Johna Sheriff, MD  nicotine (NICODERM CQ - DOSED IN MG/24 HR) 7 mg/24hr patch Place 1 patch (7 mg total)  onto the skin daily. 07/16/16   Johna Sheriff, MD    Allergies    Penicillins  Review of Systems   Review of Systems  Musculoskeletal:  Positive for back pain.  All other systems reviewed and are negative.  Physical Exam Updated Vital Signs BP 122/90   Pulse 87   Temp 97.7 F (36.5 C) (Oral)   Resp 18   Ht 5\' 9"  (1.753 m)   Wt 99.7 kg   LMP  (LMP Unknown)   SpO2 98%   BMI 32.46 kg/m   Physical Exam Vitals and nursing note reviewed.  Constitutional:      Appearance: Normal appearance.  HENT:     Head: Normocephalic and atraumatic.     Right Ear: External ear normal.     Left Ear: External ear normal.     Nose: Nose normal.     Mouth/Throat:     Mouth: Mucous membranes are moist.     Pharynx: Oropharynx is clear.  Eyes:     Extraocular Movements: Extraocular movements intact.     Conjunctiva/sclera: Conjunctivae normal.     Pupils: Pupils are equal, round, and reactive to light.  Cardiovascular:     Rate and Rhythm: Normal rate and regular rhythm.     Pulses: Normal pulses.     Heart sounds: Normal heart sounds.  Pulmonary:     Breath sounds: Wheezing present.  Abdominal:     General: Abdomen is flat. Bowel sounds are normal.     Palpations: Abdomen is soft.  Musculoskeletal:     Cervical back: Normal range of motion and neck supple.       Back:  Skin:    General: Skin is warm.     Capillary Refill: Capillary refill takes less than 2 seconds.  Neurological:     General: No focal deficit present.     Mental Status: She is alert and oriented to person, place, and time.  Psychiatric:        Mood and Affect: Mood normal.        Behavior: Behavior normal.        Thought Content: Thought content normal.        Judgment: Judgment normal.    ED Results / Procedures / Treatments   Labs (all labs ordered are listed, but only abnormal results are displayed) Labs Reviewed - No data to display  EKG None  Radiology No results  found.  Procedures Procedures   Medications Ordered in ED Medications  AeroChamber Plus Flo-Vu Medium MISC 1 each (has no administration in time range)  dexamethasone (DECADRON) injection 10 mg (10 mg Intramuscular Given 11/04/20 0822)  HYDROcodone-acetaminophen (NORCO/VICODIN) 5-325 MG per tablet 1 tablet (1 tablet Oral Given 11/04/20 0822)  albuterol (VENTOLIN HFA) 108 (90 Base) MCG/ACT inhaler 1 puff (1 puff Inhalation Given 11/04/20 4665)    ED Course  I have reviewed the triage vital signs and the nursing notes.  Pertinent labs & imaging results that were available during my care of the patient were reviewed by me and considered in my medical decision making (see chart for details).    MDM Rules/Calculators/A&P                           Pt smokes and has some wheezes.  She does have a smoker's cough which hurts her back.  She is encouraged to stop smoking and is given an albuterol inhaler + spacer.    For pain, pt is given decadron and lortab in the ED.  She is d/c with robaxin and prednisone and mobic.  She is given a note for work.  She is to return if worse.  F/u with pcp. Final Clinical Impression(s) / ED Diagnoses Final diagnoses:  Strain of lumbar region, initial encounter    Rx / DC Orders ED Discharge Orders          Ordered    predniSONE (STERAPRED UNI-PAK 21 TAB) 10 MG (21) TBPK tablet  Daily        11/04/20 0824    methocarbamol (ROBAXIN) 500 MG tablet  2 times daily        11/04/20 0824    meloxicam (MOBIC) 15 MG tablet  Daily        11/04/20 0824             Jacalyn Lefevre, MD 11/04/20 [phone]

## 2022-03-15 ENCOUNTER — Other Ambulatory Visit: Payer: Self-pay

## 2022-03-15 ENCOUNTER — Emergency Department (HOSPITAL_COMMUNITY)
Admission: EM | Admit: 2022-03-15 | Discharge: 2022-03-15 | Disposition: A | Discharge status: Home / Self Care | Payer: 59 | Attending: Emergency Medicine | Admitting: Emergency Medicine

## 2022-03-15 ENCOUNTER — Encounter (HOSPITAL_COMMUNITY): Payer: Self-pay | Admitting: *Deleted

## 2022-03-15 DIAGNOSIS — R509 Fever, unspecified: Secondary | ICD-10-CM | POA: Diagnosis not present

## 2022-03-15 DIAGNOSIS — B349 Viral infection, unspecified: Secondary | ICD-10-CM | POA: Insufficient documentation

## 2022-03-15 MED ORDER — BENZONATATE 200 MG PO CAPS: 200.0000 mg | ORAL_CAPSULE | Freq: Three times a day (TID) | ORAL | 0 refills | Status: AC | PRN

## 2022-03-15 NOTE — ED Provider Notes (Signed)
[facility] EMERGENCY DEPARTMENT Provider Note   CSN: [id number] Arrival date & time: 03/15/22  0847     History  Chief Complaint  Patient presents with   Generalized Body Aches    Claire Bradley is a 51 y.o. female.  HPI     Claire Bradley is a 51 y.o. female who presents to the Emergency Department complaining of generalized bodyaches, nausea, vomiting, and fever.  Symptoms present x 3 days.  She states that she took an at home COVID test yesterday and test results was positive.  She states she that she is here for confirmation and would like to know what to take to treat her symptoms.  She reports occasional cough but denies any chest pain or shortness of breath.  Cough mostly nonproductive.  Fever at home is subjective.  She denies any abdominal pain, diarrhea, flank pain or dysuria.  Has not taken any medications for symptom relief.  Home Medications Prior to Admission medications   Medication Sig Start Date End Date Taking? Authorizing Provider  benzonatate (TESSALON) 200 MG capsule Take 1 capsule (200 mg total) by mouth 3 (three) times daily as needed for cough. 03/15/22  Yes Chelcie Estorga, PA-C  albuterol (PROVENTIL) (2.5 MG/3ML) 0.083% nebulizer solution Take 3 mLs (2.5 mg total) by nebulization every 6 (six) hours as needed for wheezing or shortness of breath. 07/16/16   Johna Sheriff, MD  methocarbamol (ROBAXIN) 500 MG tablet Take 1 tablet (500 mg total) by mouth 2 (two) times daily. 11/04/20   Jacalyn Lefevre, MD  nicotine (NICODERM CQ - DOSED IN MG/24 HR) 7 mg/24hr patch Place 1 patch (7 mg total) onto the skin daily. 07/16/16   Johna Sheriff, MD  predniSONE (STERAPRED UNI-PAK 21 TAB) 10 MG (21) TBPK tablet Take by mouth daily. Take 6 tabs by mouth daily  for 2 days, then 5 tabs for 2 days, then 4 tabs for 2 days, then 3 tabs for 2 days, 2 tabs for 2 days, then 1 tab by mouth daily for 2 days 11/04/20   Jacalyn Lefevre, MD      Allergies    Penicillins     Review of Systems   Review of Systems  Constitutional:  Positive for chills and fever. Negative for appetite change.  HENT:  Negative for rhinorrhea, sore throat and trouble swallowing.   Respiratory:  Positive for cough. Negative for chest tightness and shortness of breath.   Cardiovascular:  Negative for chest pain.  Gastrointestinal:  Positive for nausea and vomiting. Negative for abdominal pain and diarrhea.  Genitourinary:  Negative for dysuria and flank pain.  Musculoskeletal:  Positive for myalgias. Negative for arthralgias, neck pain and neck stiffness.  Skin:  Negative for rash.  Neurological:  Negative for dizziness, weakness and numbness.    Physical Exam Updated Vital Signs BP 125/76 (BP Location: Right Arm)   Pulse (!) 107   Temp 98.8 F (37.1 C) (Oral)   Resp 20   Ht 5\' 9"  (1.753 m)   Wt 90.7 kg   LMP  (LMP Unknown)   SpO2 98%   BMI 29.53 kg/m  Physical Exam Vitals and nursing note reviewed.  Constitutional:      General: She is not in acute distress.    Appearance: Normal appearance. She is not ill-appearing or toxic-appearing.  HENT:     Right Ear: Tympanic membrane and ear canal normal.     Left Ear: Tympanic membrane and ear canal normal.  Mouth/Throat:     Mouth: Mucous membranes are moist.     Pharynx: Oropharynx is clear. No oropharyngeal exudate or posterior oropharyngeal erythema.  Cardiovascular:     Rate and Rhythm: Normal rate and regular rhythm.     Pulses: Normal pulses.  Pulmonary:     Effort: Pulmonary effort is normal. No respiratory distress.     Breath sounds: Normal breath sounds. No wheezing or rales.  Musculoskeletal:        General: Normal range of motion.     Cervical back: No rigidity or tenderness.  Lymphadenopathy:     Cervical: No cervical adenopathy.  Skin:    General: Skin is warm.     Capillary Refill: Capillary refill takes less than 2 seconds.     Findings: No rash.  Neurological:     General: No focal deficit  present.     Mental Status: She is alert.     Sensory: No sensory deficit.     Motor: No weakness.     ED Results / Procedures / Treatments   Labs (all labs ordered are listed, but only abnormal results are displayed) Labs Reviewed - No data to display  EKG None  Radiology No results found.  Procedures Procedures    Medications Ordered in ED Medications - No data to display  ED Course/ Medical Decision Making/ A&P                           Medical Decision Making Patient here with symptoms of generalized bodyaches, subjective fever, nausea and vomiting.  Symptoms present for 3 days.  Took home COVID test yesterday that was positive.  No shortness of breath or chest pain.  On my exam, patient well-appearing nontoxic.  Vital signs are reassuring.  She has a benign abdominal exam and lungs are clear to auscultation bilaterally.  She is here mainly for concern of what to take for her symptoms and confirmation.  Differential would also include pneumonia, other viral process, ACS and PE.  No significant tachycardia tachypnea or hypoxia to suggest PE.  Lung sounds are clear to auscultation so pneumonia felt less likely.  There is no increased work of breathing on exam.  Amount and/or Complexity of Data Reviewed Discussion of management or test interpretation with external provider(s): Patient reassured.  Discussed symptomatic relief.  Patient out of treatment window for antiviral.  Agreeable to symptomatic relief.  No indication for need for COVID retesting.  Work note provided at patient's request.  She appears appropriate for discharge home, return precautions were discussed.  Risk Prescription drug management.           Final Clinical Impression(s) / ED Diagnoses Final diagnoses:  Viral illness    Rx / DC Orders ED Discharge Orders          Ordered    benzonatate (TESSALON) 200 MG capsule  3 times daily PRN        03/15/22 0918              Pauline Aus, PA-C 03/18/22 1424    Bethann Berkshire, MD 03/20/22 1134

## 2022-03-15 NOTE — Discharge Instructions (Signed)
Rest, fluids.  Alternate tylenol and Ibuprofen every 4-6 hrs as needed for fever and or body aches.

## 2022-03-15 NOTE — ED Triage Notes (Signed)
Pt c/o generalized body aches with n/v and fever x 3 days

## 2022-03-20 DIAGNOSIS — H66001 Acute suppurative otitis media without spontaneous rupture of ear drum, right ear: Secondary | ICD-10-CM | POA: Diagnosis not present

## 2022-03-20 DIAGNOSIS — R0981 Nasal congestion: Secondary | ICD-10-CM | POA: Diagnosis not present

## 2022-03-20 DIAGNOSIS — R42 Dizziness and giddiness: Secondary | ICD-10-CM | POA: Diagnosis not present

## 2022-03-20 DIAGNOSIS — U071 COVID-19: Secondary | ICD-10-CM | POA: Diagnosis not present

## 2022-03-20 DIAGNOSIS — R1110 Vomiting, unspecified: Secondary | ICD-10-CM | POA: Diagnosis not present

## 2022-03-20 DIAGNOSIS — R69 Illness, unspecified: Secondary | ICD-10-CM | POA: Diagnosis not present

## 2022-03-20 DIAGNOSIS — Z720 Tobacco use: Secondary | ICD-10-CM | POA: Diagnosis not present

## 2022-03-20 DIAGNOSIS — Z880 Allergy status to penicillin: Secondary | ICD-10-CM | POA: Diagnosis not present

## 2023-09-07 NOTE — H&P (Signed)
 Surgical History & Physical  Patient Name: Claire Bradley  DOB: Jun 02, 1970  Surgery: Cataract extraction with intraocular lens implant phacoemulsification; Left Eye Surgeon: [name] MD Surgery Date: 09/12/2023 Pre-Op Date: 07/28/2023  HPI: A 87 Yr. old female patient presents for cataract eval. LEE yesterday from Dr. Alexia Idler. Vision is very blurry OS>OD. Dry, watering at night- not using ARTs or any drops. Many floaters. Glare from headlights at night and daytime, hard to see distance and near. Denies eye pain, flashes, diplopia. C/O poor VA at all ranges OS, blurry OD. This is negatively affecting the patient\'s quality of life and the patient is unable to function adequately in life with the current level of vision.  HPI Completed by Dr. [name]  Medical History: Glaucoma Cataracts  High Blood Pressure LDL  Review of Systems Cardiovascular High Blood Pressure, LDL All recorded systems are negative except as noted above.  Social Current every day smoker  Medication Lipitor tablet, Prednisolone acetate, Ilevro, Moxifloxacin  Sx/Procedures Hysterectomy, Cholecystectomy, Throat Sx- Fluid Sacks removed  Drug Allergies  penicillin   History & Physical: Heent: cataracts NECK: supple without bruits LUNGS: lungs clear to auscultation CV: regular rate and rhythm Abdomen: soft and non-tender  Impression & Plan: Assessment: 1.  COMBINED FORMS AGE RELATED CATARACT; Both Eyes (H25.813) 2.  OAG BORDERLINE FINDINGS LOW RISK; Both Eyes (H40.013) 3.  BLEPHARITIS; Right Upper Lid, Left Upper Lid (H01.001, H01.004) 4.  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  Cataract accounts for the patient's decreased vision. This visual impairment is not correctable with a tolerable change in glasses or contact lenses. Cataract surgery with an implantation of a new lens should significantly improve the visual and functional status of  the patient. Discussed all risks, benefits, alternatives, and potential complications. Discussed the procedures and recovery. Patient desires to have surgery. A-scan ordered and performed today for intra-ocular lens calculations. The surgery will be performed in order to improve vision for driving, reading, and for eye examinations. Recommend phacoemulsification with intra-ocular lens. Recommend Dextenza  for post-operative pain and inflammation. Left Eye ,much worse -first. Dilates well - shugarcaine by protocol. Vision Blue in room.  2.  Based on cup-to-disc ratio. Negative Family history. OCT rNFL shows: poor study today. will repeat glaucoma testing after cataract surgery. Detailed discussion about glaucoma today including importance of maintaining good follow up and following treatment plan, and the possibility of irreversible blindness as part of this disease process.  3.  Blepharitis is present - recommend regular lid cleaning.  4.  Monitor. Benign in nature.

## 2023-09-08 ENCOUNTER — Encounter (HOSPITAL_COMMUNITY)
Admission: RE | Admit: 2023-09-08 | Discharge: 2023-09-08 | Disposition: A | Discharge status: Home / Self Care | Source: Ambulatory Visit | Attending: Ophthalmology | Admitting: Ophthalmology

## 2023-09-12 ENCOUNTER — Ambulatory Visit (HOSPITAL_COMMUNITY)
Admission: RE | Admit: 2023-09-12 | Discharge: 2023-09-12 | Disposition: A | Discharge status: Home / Self Care | Payer: Self-pay | Source: Ambulatory Visit | Attending: Ophthalmology | Admitting: Ophthalmology

## 2023-09-12 ENCOUNTER — Ambulatory Visit (HOSPITAL_COMMUNITY): Payer: Self-pay | Admitting: Anesthesiology

## 2023-09-12 ENCOUNTER — Encounter (HOSPITAL_COMMUNITY)
Admission: RE | Disposition: A | Discharge status: Home / Self Care | Payer: Self-pay | Source: Ambulatory Visit | Attending: Ophthalmology

## 2023-09-12 DIAGNOSIS — H25812 Combined forms of age-related cataract, left eye: Secondary | ICD-10-CM | POA: Insufficient documentation

## 2023-09-12 HISTORY — PX: CATARACT EXTRACTION W/PHACO: SHX586

## 2023-09-12 SURGERY — PHACOEMULSIFICATION, CATARACT, WITH IOL INSERTION
Anesthesia: Monitor Anesthesia Care | Site: Eye | Laterality: Left

## 2023-09-12 MED ORDER — POVIDONE-IODINE 5 % OP SOLN
OPHTHALMIC | Status: DC | PRN
Start: 2023-09-12 — End: 2023-09-12
  Administered 2023-09-12: 1 via OPHTHALMIC

## 2023-09-12 MED ORDER — PHENYLEPHRINE HCL 2.5 % OP SOLN
1.0000 [drp] | OPHTHALMIC | Status: AC | PRN
  Administered 2023-09-12 (×3): 1 [drp] via OPHTHALMIC

## 2023-09-12 MED ORDER — LACTATED RINGERS IV SOLN
INTRAVENOUS | Status: DC
Start: 2023-09-12 — End: 2023-09-12

## 2023-09-12 MED ORDER — LIDOCAINE HCL 3.5 % OP GEL
1.0000 | Freq: Once | OPHTHALMIC | Status: AC
  Administered 2023-09-12: 1 via OPHTHALMIC

## 2023-09-12 MED ORDER — MIDAZOLAM HCL 2 MG/2ML IJ SOLN
INTRAMUSCULAR | Status: AC
  Filled 2023-09-12: qty 2

## 2023-09-12 MED ORDER — MOXIFLOXACIN HCL 5 MG/ML IO SOLN
INTRAOCULAR | Status: DC | PRN
  Administered 2023-09-12: .3 mL via INTRACAMERAL

## 2023-09-12 MED ORDER — BSS IO SOLN
INTRAOCULAR | Status: DC | PRN
  Administered 2023-09-12: 15 mL via INTRAOCULAR

## 2023-09-12 MED ORDER — TRYPAN BLUE 0.06 % IO SOSY
PREFILLED_SYRINGE | INTRAOCULAR | Status: DC | PRN
  Administered 2023-09-12: .5 mL via INTRAOCULAR

## 2023-09-12 MED ORDER — TETRACAINE HCL 0.5 % OP SOLN
1.0000 [drp] | OPHTHALMIC | Status: AC | PRN
  Administered 2023-09-12 (×3): 1 [drp] via OPHTHALMIC

## 2023-09-12 MED ORDER — TROPICAMIDE 1 % OP SOLN
1.0000 [drp] | OPHTHALMIC | Status: AC | PRN
  Administered 2023-09-12 (×3): 1 [drp] via OPHTHALMIC

## 2023-09-12 MED ORDER — TRYPAN BLUE 0.06 % IO SOSY
PREFILLED_SYRINGE | INTRAOCULAR | Status: AC
Start: 2023-09-12 — End: ?
  Filled 2023-09-12: qty 0.5

## 2023-09-12 MED ORDER — PHENYLEPHRINE-KETOROLAC 1-0.3 % IO SOLN
INTRAOCULAR | Status: DC | PRN
  Administered 2023-09-12: 500 mL via OPHTHALMIC

## 2023-09-12 MED ORDER — SODIUM HYALURONATE 23MG/ML IO SOSY
PREFILLED_SYRINGE | INTRAOCULAR | Status: DC | PRN
  Administered 2023-09-12: .6 mL via INTRAOCULAR

## 2023-09-12 MED ORDER — SODIUM HYALURONATE 10 MG/ML IO SOLUTION
PREFILLED_SYRINGE | INTRAOCULAR | Status: DC | PRN
  Administered 2023-09-12: .85 mL via INTRAOCULAR

## 2023-09-12 MED ORDER — STERILE WATER FOR IRRIGATION IR SOLN
Status: DC | PRN
  Administered 2023-09-12: 25 mL

## 2023-09-12 MED ORDER — LIDOCAINE HCL (PF) 1 % IJ SOLN
INTRAOCULAR | Status: DC | PRN
  Administered 2023-09-12: 1 mL via OPHTHALMIC

## 2023-09-12 MED ORDER — MIDAZOLAM HCL 2 MG/2ML IJ SOLN
INTRAMUSCULAR | Status: DC | PRN
  Administered 2023-09-12: 2 mg via INTRAVENOUS

## 2023-09-12 SURGICAL SUPPLY — 13 items
CATARACT SUITE SIGHTPATH (MISCELLANEOUS) ×1 IMPLANT
CLOTH BEACON ORANGE TIMEOUT ST (SAFETY) ×1 IMPLANT
EYE SHIELD UNIVERSAL CLEAR (GAUZE/BANDAGES/DRESSINGS) IMPLANT
FEE CATARACT SUITE SIGHTPATH (MISCELLANEOUS) ×1 IMPLANT
GLOVE BIOGEL PI IND STRL 6.5 (GLOVE) IMPLANT
GLOVE BIOGEL PI IND STRL 7.0 (GLOVE) ×2 IMPLANT
LENS IOL TECNIS EYHANCE 23.0 (Intraocular Lens) IMPLANT
NDL HYPO 18GX1.5 BLUNT FILL (NEEDLE) ×1 IMPLANT
NEEDLE HYPO 18GX1.5 BLUNT FILL (NEEDLE) ×1 IMPLANT
PAD ARMBOARD POSITIONER FOAM (MISCELLANEOUS) ×1 IMPLANT
SYR TB 1ML LL NO SAFETY (SYRINGE) ×1 IMPLANT
TAPE SURG TRANSPORE 1 IN (GAUZE/BANDAGES/DRESSINGS) IMPLANT
WATER STERILE IRR 250ML POUR (IV SOLUTION) ×1 IMPLANT

## 2023-09-12 NOTE — Interval H&P Note (Signed)
 History and Physical Interval Note:  09/12/2023 12:11 PM  Claire Bradley  has presented today for surgery, with the diagnosis of combined forms age related cataract, left eye.  The various methods of treatment have been discussed with the patient and family. After consideration of risks, benefits and other options for treatment, the patient has consented to  Procedure(s) with comments: PHACOEMULSIFICATION, CATARACT, WITH IOL INSERTION (Left) - CDE: as a surgical intervention.  The patient's history has been reviewed, patient examined, no change in status, stable for surgery.  I have reviewed the patient's chart and labs.  Questions were answered to the patient's satisfaction.     [name]

## 2023-09-12 NOTE — Anesthesia Preprocedure Evaluation (Signed)
 Anesthesia Evaluation  Patient identified by MRN, date of birth, ID band Patient awake    Reviewed: Allergy & Precautions, H&P , NPO status , Patient's Chart, lab work & pertinent test results, reviewed documented beta blocker date and time   Airway Mallampati: II  TM Distance: >3 FB Neck ROM: full    Dental no notable dental hx. (+) Dental Advisory Given, Teeth Intact   Pulmonary pneumonia, resolved   Pulmonary exam normal breath sounds clear to auscultation       Cardiovascular Exercise Tolerance: Good negative cardio ROS Normal cardiovascular exam Rhythm:regular Rate:Normal     Neuro/Psych negative neurological ROS  negative psych ROS   GI/Hepatic negative GI ROS, Neg liver ROS,,,  Endo/Other  negative endocrine ROS    Renal/GU negative Renal ROS  negative genitourinary   Musculoskeletal   Abdominal   Peds  Hematology negative hematology ROS (+)   Anesthesia Other Findings   Reproductive/Obstetrics negative OB ROS                             Anesthesia Physical Anesthesia Plan  ASA: 2  Anesthesia Plan: MAC   Post-op Pain Management:    Induction:   PONV Risk Score and Plan:   Airway Management Planned: Nasal Cannula and Natural Airway  Additional Equipment: None  Intra-op Plan:   Post-operative Plan:   Informed Consent: I have reviewed the patients History and Physical, chart, labs and discussed the procedure including the risks, benefits and alternatives for the proposed anesthesia with the patient or authorized representative who has indicated his/her understanding and acceptance.     Dental Advisory Given  Plan Discussed with: CRNA  Anesthesia Plan Comments:        Anesthesia Quick Evaluation

## 2023-09-12 NOTE — Op Note (Signed)
 Date of procedure: 09/12/23  Pre-operative diagnosis: Visually significant age-related combined cataract, Left Eye (H25.812)  Post-operative diagnosis: Visually significant age-related combined cataract, Left Eye (H25.812)  Procedure: Removal of cataract via phacoemulsification and insertion of intra-ocular lens [employer] DIB00 +23.0D into the capsular bag of the Left Eye  Attending surgeon: Pleas Brill. Catarina Huntley, MD, MA  Anesthesia: MAC, Topical Akten  Complications: None  Estimated Blood Loss: <21mL (minimal)  Specimens: None  Implants: As above  Indications:  Visually significant age-related cataract, Left Eye  Procedure:  The patient was seen and identified in the pre-operative area. The operative eye was identified and dilated.  The operative eye was marked.  Topical anesthesia was administered to the operative eye.     The patient was then to the operative suite and placed in the supine position.  A timeout was performed confirming the patient, procedure to be performed, and all other relevant information.   The patient's face was prepped and draped in the usual fashion for intra-ocular surgery.  A lid speculum was placed into the operative eye and the surgical microscope moved into place and focused.  An inferotemporal paracentesis was created using a 20 gauge paracentesis blade.  Shugarcaine was injected into the anterior chamber. Vision Blue was used to stain the anterior capsule and was irrigated from the anterior chamber with BSS. Viscoelastic was injected into the anterior chamber.  A temporal clear-corneal main wound incision was created using a 2.63mm microkeratome.  A continuous curvilinear capsulorrhexis was initiated using an irrigating cystitome and completed using capsulorrhexis forceps.  Hydrodissection and hydrodeliniation were performed.  Viscoelastic was injected into the anterior chamber.  A phacoemulsification handpiece and a chopper as a second instrument were  used to remove the nucleus and epinucleus. The irrigation/aspiration handpiece was used to remove any remaining cortical material.   The capsular bag was reinflated with viscoelastic, checked, and found to be intact.  The intraocular lens was inserted into the capsular bag.  The irrigation/aspiration handpiece was used to remove any remaining viscoelastic.  The clear corneal wound and paracentesis wounds were then hydrated and checked with Weck-Cels to be watertight. 0.1mL of Moxfloxacin was injected into the anterior chamber. The lid-speculum was removed.  The drape was removed.  The patient's face was cleaned with a wet and dry 4x4.    A clear shield was taped over the eye. The patient was taken to the post-operative care unit in good condition, having tolerated the procedure well.  Post-Op Instructions: The patient will follow up at [facility] for a same day post-operative evaluation and will receive all other orders and instructions.

## 2023-09-12 NOTE — Anesthesia Postprocedure Evaluation (Signed)
 Anesthesia Post Note  Patient: Claire Bradley  Procedure(s) Performed: PHACOEMULSIFICATION, CATARACT, WITH IOL INSERTION (Left: Eye)  Patient location during evaluation: PACU Anesthesia Type: MAC Level of consciousness: awake and alert Pain management: pain level controlled Vital Signs Assessment: post-procedure vital signs reviewed and stable Respiratory status: spontaneous breathing, nonlabored ventilation, respiratory function stable and patient connected to nasal cannula oxygen  Cardiovascular status: stable and blood pressure returned to baseline Postop Assessment: no apparent nausea or vomiting Anesthetic complications: no   There were no known notable events for this encounter.   Last Vitals:  Vitals:   09/12/23 1141 09/12/23 1314  BP: (!) 130/90 124/84  Pulse: 80 89  Resp: 18 (!) 22  Temp: 36.6 C 36.8 C  SpO2: 98% 96%    Last Pain:  Vitals:   09/12/23 1314  TempSrc: Oral  PainSc: 0-No pain                 Virgen Belland L Latrina Guttman

## 2023-09-12 NOTE — Transfer of Care (Signed)
 Immediate Anesthesia Transfer of Care Note  Patient: Claire Bradley  Procedure(s) Performed: PHACOEMULSIFICATION, CATARACT, WITH IOL INSERTION (Left: Eye)  Patient Location: Short Stay  Anesthesia Type:MAC  Level of Consciousness: awake, alert , and oriented  Airway & Oxygen  Therapy: Patient Spontanous Breathing  Post-op Assessment: Report given to RN and Post -op Vital signs reviewed and stable  Post vital signs: Reviewed and stable  Last Vitals:  Vitals Value Taken Time  BP    Temp    Pulse    Resp    SpO2      Last Pain:  Vitals:   09/12/23 1141  TempSrc: Oral  PainSc: 0-No pain      Patients Stated Pain Goal: 3 (09/12/23 1141)  Complications: No notable events documented.

## 2023-09-13 ENCOUNTER — Encounter (HOSPITAL_COMMUNITY): Payer: Self-pay | Admitting: Ophthalmology

## 2023-09-20 ENCOUNTER — Encounter (HOSPITAL_COMMUNITY): Payer: Self-pay

## 2023-09-20 ENCOUNTER — Other Ambulatory Visit: Payer: Self-pay

## 2023-09-20 ENCOUNTER — Encounter (HOSPITAL_COMMUNITY)
Admission: RE | Admit: 2023-09-20 | Discharge: 2023-09-20 | Disposition: A | Discharge status: Home / Self Care | Payer: Self-pay | Source: Ambulatory Visit | Attending: Ophthalmology | Admitting: Ophthalmology

## 2023-09-22 NOTE — H&P (Signed)
 Surgical History & Physical  Patient Name: Claire Bradley  DOB: 19-Jan-1971  Surgery: Cataract extraction with intraocular lens implant phacoemulsification; Right Eye Surgeon: [name] MD Surgery Date: 09/26/2023 Pre-Op Date: 09/19/2023  HPI: A 76 Yr. old female patient 1. The patient is returning after cataract surgery. The left eye is affected. Status post cataract surgery, which began 1 week ago: Since the last visit, the affected area feels improvement. The patient's vision is improved. The condition's severity is constant. Patient is following medication instructions. The patient states she sees the side of her lens in her vision 2.  The patient is returning for a cataract follow-up of the right eye. Since the last visit, the affected area is worsening. The patient's vision is blurry. The condition's severity is constant. There is a large difference between the two eyes. This is negatively affecting the patient's quality of life and the patient is unable to function adequately in life with the current level of vision. Patient is not taking medications. HPI Completed by Dr. [name]  Medical History: Glaucoma Cataracts  High Blood Pressure LDL  Review of Systems Cardiovascular High Blood Pressure, LDL All recorded systems are negative except as noted above.  Social Current every day smoker   Medication Prednisolone acetate, Prednisolone acetate, Moxifloxacin ,  Lipitor tablet, Prednisolone acetate, Moxifloxacin   Sx/Procedures Phaco c IOL OS,  Hysterectomy, Cholecystectomy, Throat Sx- Fluid Sacks removed  Drug Allergies  penicillin   History & Physical: Heent: cataract NECK: supple without bruits LUNGS: lungs clear to auscultation CV: regular rate and rhythm Abdomen: soft and non-tender  Impression & Plan: Assessment: 1.  CATARACT EXTRACTION STATUS; Left Eye (Z98.42) 2.  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  Diabetes Type 2 No  retinopathy (E11.9)  Plan: 1.  1 week after cataract surgery. Doing well with improved vision and normal eye pressure. Call with any problems or concerns. Stop Vigamox . Continue Ilevro 1 drop 1x/day for 3 more weeks. Continue Pred Acetate 1 drop 2x/day for 3 more weeks.  2.  Cataract accounts for the patient's decreased vision. This visual impairment is not correctable with a tolerable change in glasses or contact lenses. Cataract surgery with an implantation of a new lens should significantly improve the visual and functional status of the patient.Discussed all risks, benefits, alternatives, and potential complications. Discussed the procedures and recovery. Patient desires to have surgery. A-scan ordered and performed today for intra-ocular lens calculations. The surgery will be performed in order to improve vision for driving, reading, and for eye examinations. Recommend phacoemulsification with intra-ocular lens. Recommend Dextenza  for post-operative pain and inflammation. History of refractive Surgery: None Use of Eye Pressure Lowering Drops: No Right Eye. Surgery required to correct imbalance of vision. Dilates well - shugarcaine or Lidocaine +Omidira by protocol  3.  Stressed importance of blood sugar and blood pressure control, and also yearly eye examinations. Discussed the need for ongoing proactive ocular exams and treatment, hopefully before visual symptoms develop.

## 2023-09-26 ENCOUNTER — Encounter (HOSPITAL_COMMUNITY)
Admission: RE | Disposition: A | Discharge status: Home / Self Care | Payer: Self-pay | Source: Home / Self Care | Attending: Ophthalmology

## 2023-09-26 ENCOUNTER — Ambulatory Visit (HOSPITAL_COMMUNITY): Payer: Self-pay | Admitting: Anesthesiology

## 2023-09-26 ENCOUNTER — Ambulatory Visit (HOSPITAL_COMMUNITY): Admission: RE | Admit: 2023-09-26 | Payer: Self-pay | Source: Home / Self Care | Admitting: Ophthalmology

## 2023-09-26 ENCOUNTER — Other Ambulatory Visit: Payer: Self-pay

## 2023-09-26 ENCOUNTER — Encounter (HOSPITAL_COMMUNITY): Payer: Self-pay | Admitting: Ophthalmology

## 2023-09-26 DIAGNOSIS — H25811 Combined forms of age-related cataract, right eye: Secondary | ICD-10-CM | POA: Diagnosis present

## 2023-09-26 DIAGNOSIS — E119 Type 2 diabetes mellitus without complications: Secondary | ICD-10-CM | POA: Diagnosis not present

## 2023-09-26 DIAGNOSIS — F17200 Nicotine dependence, unspecified, uncomplicated: Secondary | ICD-10-CM | POA: Insufficient documentation

## 2023-09-26 HISTORY — PX: CATARACT EXTRACTION W/PHACO: SHX586

## 2023-09-26 SURGERY — PHACOEMULSIFICATION, CATARACT, WITH IOL INSERTION
Anesthesia: Monitor Anesthesia Care | Site: Eye | Laterality: Right

## 2023-09-26 MED ORDER — SODIUM CHLORIDE (PF) 0.9 % IJ SOLN
INTRAMUSCULAR | Status: AC
  Filled 2023-09-26: qty 10

## 2023-09-26 MED ORDER — PHENYLEPHRINE-KETOROLAC 1-0.3 % IO SOLN
INTRAOCULAR | Status: DC | PRN
  Administered 2023-09-26: 500 mL via OPHTHALMIC

## 2023-09-26 MED ORDER — SODIUM CHLORIDE 0.9% FLUSH
INTRAVENOUS | Status: DC | PRN
  Administered 2023-09-26: 10 mL via INTRAVENOUS

## 2023-09-26 MED ORDER — TETRACAINE HCL 0.5 % OP SOLN
1.0000 [drp] | OPHTHALMIC | Status: AC | PRN
  Administered 2023-09-26 (×3): 1 [drp] via OPHTHALMIC

## 2023-09-26 MED ORDER — TROPICAMIDE 1 % OP SOLN
1.0000 [drp] | OPHTHALMIC | Status: AC | PRN
  Administered 2023-09-26 (×3): 1 [drp] via OPHTHALMIC

## 2023-09-26 MED ORDER — STERILE WATER FOR IRRIGATION IR SOLN
Status: DC | PRN
Start: 2023-09-26 — End: 2023-09-26
  Administered 2023-09-26: 250 mL

## 2023-09-26 MED ORDER — MIDAZOLAM HCL 2 MG/2ML IJ SOLN
INTRAMUSCULAR | Status: AC
Start: 2023-09-26 — End: 2023-09-26
  Filled 2023-09-26: qty 2

## 2023-09-26 MED ORDER — LIDOCAINE HCL 3.5 % OP GEL
1.0000 | Freq: Once | OPHTHALMIC | Status: AC
  Administered 2023-09-26: 1 via OPHTHALMIC

## 2023-09-26 MED ORDER — BSS IO SOLN
INTRAOCULAR | Status: DC | PRN
Start: 2023-09-26 — End: 2023-09-26
  Administered 2023-09-26: 15 mL via INTRAOCULAR

## 2023-09-26 MED ORDER — SODIUM HYALURONATE 10 MG/ML IO SOLUTION
PREFILLED_SYRINGE | INTRAOCULAR | Status: DC | PRN
  Administered 2023-09-26: .85 mL via INTRAOCULAR

## 2023-09-26 MED ORDER — PHENYLEPHRINE HCL 2.5 % OP SOLN
1.0000 [drp] | OPHTHALMIC | Status: AC | PRN
  Administered 2023-09-26 (×3): 1 [drp] via OPHTHALMIC

## 2023-09-26 MED ORDER — POVIDONE-IODINE 5 % OP SOLN
OPHTHALMIC | Status: DC | PRN
  Administered 2023-09-26: 1 via OPHTHALMIC

## 2023-09-26 MED ORDER — MIDAZOLAM HCL 2 MG/2ML IJ SOLN
INTRAMUSCULAR | Status: DC | PRN
  Administered 2023-09-26: 2 mg via INTRAVENOUS

## 2023-09-26 MED ORDER — MOXIFLOXACIN HCL 5 MG/ML IO SOLN
INTRAOCULAR | Status: DC | PRN
  Administered 2023-09-26: .2 mL via INTRACAMERAL

## 2023-09-26 MED ORDER — LACTATED RINGERS IV SOLN: INTRAVENOUS | Status: DC

## 2023-09-26 MED ORDER — SODIUM HYALURONATE 23MG/ML IO SOSY
PREFILLED_SYRINGE | INTRAOCULAR | Status: DC | PRN
  Administered 2023-09-26: .6 mL via INTRAOCULAR

## 2023-09-26 SURGICAL SUPPLY — 12 items
CATARACT SUITE SIGHTPATH (MISCELLANEOUS) ×1 IMPLANT
CLOTH BEACON ORANGE TIMEOUT ST (SAFETY) ×1 IMPLANT
EYE SHIELD UNIVERSAL CLEAR (GAUZE/BANDAGES/DRESSINGS) IMPLANT
FEE CATARACT SUITE SIGHTPATH (MISCELLANEOUS) ×1 IMPLANT
GLOVE BIOGEL PI IND STRL 7.0 (GLOVE) ×2 IMPLANT
LENS IOL TECNIS EYHANCE 23.0 (Intraocular Lens) IMPLANT
NDL HYPO 18GX1.5 BLUNT FILL (NEEDLE) ×1 IMPLANT
NEEDLE HYPO 18GX1.5 BLUNT FILL (NEEDLE) ×1 IMPLANT
PAD ARMBOARD POSITIONER FOAM (MISCELLANEOUS) ×1 IMPLANT
SYR TB 1ML LL NO SAFETY (SYRINGE) ×1 IMPLANT
TAPE SURG TRANSPORE 1 IN (GAUZE/BANDAGES/DRESSINGS) IMPLANT
WATER STERILE IRR 250ML POUR (IV SOLUTION) ×1 IMPLANT

## 2023-09-26 NOTE — Discharge Instructions (Signed)
 Please discharge patient when stable, will follow up today with Dr. June Leap at the [facility] office immediately following discharge.  Leave shield in place until visit.  All paperwork with discharge instructions will be given at the office.  [facility] Address:  [address]  [place], [place] [address]

## 2023-09-26 NOTE — Anesthesia Preprocedure Evaluation (Signed)
 Anesthesia Evaluation  Patient identified by MRN, date of birth, ID band Patient awake    Reviewed: Allergy & Precautions, H&P , NPO status , Patient's Chart, lab work & pertinent test results, reviewed documented beta blocker date and time   Airway Mallampati: II  TM Distance: >3 FB Neck ROM: full    Dental no notable dental hx. (+) Dental Advisory Given, Teeth Intact   Pulmonary pneumonia, resolved, Current Smoker and Patient abstained from smoking.   Pulmonary exam normal breath sounds clear to auscultation       Cardiovascular Exercise Tolerance: Good negative cardio ROS Normal cardiovascular exam Rhythm:regular Rate:Normal     Neuro/Psych negative neurological ROS  negative psych ROS   GI/Hepatic negative GI ROS, Neg liver ROS,,,  Endo/Other  negative endocrine ROS    Renal/GU negative Renal ROS  negative genitourinary   Musculoskeletal   Abdominal   Peds  Hematology negative hematology ROS (+)   Anesthesia Other Findings   Reproductive/Obstetrics negative OB ROS                              Anesthesia Physical Anesthesia Plan  ASA: 2  Anesthesia Plan: MAC   Post-op Pain Management:    Induction:   PONV Risk Score and Plan:   Airway Management Planned: Nasal Cannula and Natural Airway  Additional Equipment: None  Intra-op Plan:   Post-operative Plan:   Informed Consent: I have reviewed the patients History and Physical, chart, labs and discussed the procedure including the risks, benefits and alternatives for the proposed anesthesia with the patient or authorized representative who has indicated his/her understanding and acceptance.     Dental Advisory Given  Plan Discussed with: CRNA  Anesthesia Plan Comments:         Anesthesia Quick Evaluation

## 2023-09-26 NOTE — Anesthesia Postprocedure Evaluation (Signed)
 Anesthesia Post Note  Patient: Claire Bradley  Procedure(s) Performed: PHACOEMULSIFICATION, CATARACT, WITH IOL INSERTION (Right: Eye)  Patient location during evaluation: Phase II Anesthesia Type: MAC Level of consciousness: awake Pain management: pain level controlled Vital Signs Assessment: post-procedure vital signs reviewed and stable Respiratory status: spontaneous breathing and respiratory function stable Cardiovascular status: blood pressure returned to baseline and stable Postop Assessment: no headache and no apparent nausea or vomiting Anesthetic complications: no Comments: Late entry   No notable events documented.   Last Vitals:  Vitals:   09/26/23 0742 09/26/23 0903  BP: 108/84 (!) 140/86  Pulse: 72 80  Resp: 18 18  Temp: 36.9 C (!) 36.4 C  SpO2: 98% 98%    Last Pain:  Vitals:   09/26/23 0904  TempSrc:   PainSc: 0-No pain                 [name]

## 2023-09-26 NOTE — Transfer of Care (Signed)
 Immediate Anesthesia Transfer of Care Note  Patient: Claire Bradley  Procedure(s) Performed: PHACOEMULSIFICATION, CATARACT, WITH IOL INSERTION (Right: Eye)  Patient Location: Short Stay  Anesthesia Type:MAC  Level of Consciousness: awake, alert , and oriented  Airway & Oxygen  Therapy: Patient Spontanous Breathing  Post-op Assessment: Report given to RN and Post -op Vital signs reviewed and stable  Post vital signs: Reviewed and stable  Last Vitals:  Vitals Value Taken Time  BP 140/86 09/26/23 09:03  Temp 36.4 C 09/26/23 09:03  Pulse 80 09/26/23 09:03  Resp 18 09/26/23 09:03  SpO2 98 % 09/26/23 09:03    Last Pain:  Vitals:   09/26/23 0903  TempSrc: Oral  PainSc: 0-No pain      Patients Stated Pain Goal: 4 (09/26/23 0742)  Complications: No notable events documented.

## 2023-09-26 NOTE — Op Note (Signed)
 Date of procedure: 09/26/23  Pre-operative diagnosis:  Visually significant combined form age-related cataract, Right Eye (H25.811)  Post-operative diagnosis:  Visually significant combined form age-related cataract, Right Eye (H25.811)  Procedure: Removal of cataract via phacoemulsification and insertion of intra-ocular lens [employer] DIB00 +23.0D into the capsular bag of the Right Eye  Attending surgeon: Lynwood LABOR. Burlon Centrella, MD, MA  Anesthesia: MAC, Topical Akten   Complications: None  Estimated Blood Loss: <23mL (minimal)  Specimens: None  Implants: As above  Indications:  Visually significant age-related cataract, Right Eye  Procedure:  The patient was seen and identified in the pre-operative area. The operative eye was identified and dilated.  The operative eye was marked.  Topical anesthesia was administered to the operative eye.     The patient was then to the operative suite and placed in the supine position.  A timeout was performed confirming the patient, procedure to be performed, and all other relevant information.   The patient's face was prepped and draped in the usual fashion for intra-ocular surgery.  A lid speculum was placed into the operative eye and the surgical microscope moved into place and focused.  A superotemporal paracentesis was created using a 20 gauge paracentesis blade. Omidria  was injected into the anterior chamber. Shugarcaine was injected into the anterior chamber.  Viscoelastic was injected into the anterior chamber.  A temporal clear-corneal main wound incision was created using a 2.48mm microkeratome.  A continuous curvilinear capsulorrhexis was initiated using an irrigating cystitome and completed using capsulorrhexis forceps.  Hydrodissection and hydrodeliniation were performed.  Viscoelastic was injected into the anterior chamber.  A phacoemulsification handpiece and a chopper as a second instrument were used to remove the nucleus and epinucleus.  The irrigation/aspiration handpiece was used to remove any remaining cortical material.   The capsular bag was reinflated with viscoelastic, checked, and found to be intact.  The intraocular lens was inserted into the capsular bag.  The irrigation/aspiration handpiece was used to remove any remaining viscoelastic.  The clear corneal wound and paracentesis wounds were then hydrated and checked with Weck-Cels to be watertight. 0.1mL of Moxfloxacin was injected into the anterior chamber. The lid-speculum was removed.  The drape was removed.  The patient's face was cleaned with a wet and dry 4x4. A clear shield was taped over the eye. The patient was taken to the post-operative care unit in good condition, having tolerated the procedure well.  Post-Op Instructions: The patient will follow up at [facility] for a same day post-operative evaluation and will receive all other orders and instructions.

## 2023-09-26 NOTE — Interval H&P Note (Signed)
 History and Physical Interval Note:  09/26/2023 8:41 AM  Claire Bradley  has presented today for surgery, with the diagnosis of combined forms age related cataract, right eye.  The various methods of treatment have been discussed with the patient and family. After consideration of risks, benefits and other options for treatment, the patient has consented to  Procedure(s): PHACOEMULSIFICATION, CATARACT, WITH IOL INSERTION (Right) as a surgical intervention.  The patient's history has been reviewed, patient examined, no change in status, stable for surgery.  I have reviewed the patient's chart and labs.  Questions were answered to the patient's satisfaction.     HARRIE AGENT

## 2023-09-27 ENCOUNTER — Encounter (HOSPITAL_COMMUNITY): Payer: Self-pay | Admitting: Ophthalmology

## 2024-01-12 ENCOUNTER — Ambulatory Visit (INDEPENDENT_AMBULATORY_CARE_PROVIDER_SITE_OTHER)

## 2024-01-12 ENCOUNTER — Ambulatory Visit: Admitting: Podiatry

## 2024-01-12 VITALS — Ht 69.0 in | Wt 217.0 lb

## 2024-01-12 DIAGNOSIS — M722 Plantar fascial fibromatosis: Secondary | ICD-10-CM | POA: Diagnosis not present

## 2024-01-12 DIAGNOSIS — M7751 Other enthesopathy of right foot: Secondary | ICD-10-CM | POA: Diagnosis not present

## 2024-01-12 DIAGNOSIS — M25571 Pain in right ankle and joints of right foot: Secondary | ICD-10-CM | POA: Diagnosis not present

## 2024-01-12 DIAGNOSIS — S86311A Strain of muscle(s) and tendon(s) of peroneal muscle group at lower leg level, right leg, initial encounter: Secondary | ICD-10-CM

## 2024-01-12 MED ORDER — MELOXICAM 15 MG PO TABS: 15.0000 mg | ORAL_TABLET | Freq: Every day | ORAL | 0 refills | Status: AC

## 2024-01-12 MED ORDER — METHYLPREDNISOLONE 4 MG PO TBPK: ORAL_TABLET | ORAL | 0 refills | Status: AC

## 2024-01-12 NOTE — Patient Instructions (Signed)
 Call [facility] to schedule your MRI at the below locations.  Please allow at least 1 business day after your visit to process the referral.  It may take longer depending on approval from insurance.  Please let me know if you have issues or problems scheduling the MRI   [facility] [place] [phone] [address] [place], [place] [address]  [facility] [phone] [place], [place] [address]                          Contains text generated by Abridge.                                 Contains text generated by Abridge.  Plantar Fasciitis (Heel Spur Syndrome) with Rehab The plantar fascia is a fibrous, ligament-like, soft-tissue structure that spans the bottom of the foot. Plantar fasciitis is a condition that causes pain in the foot due to inflammation of the tissue. SYMPTOMS  Pain and tenderness on the underneath side of the foot. Pain that worsens with standing or walking. CAUSES  Plantar fasciitis is caused by irritation and injury to the plantar fascia on the underneath side of the foot. Common mechanisms of injury include: Direct trauma to bottom of the foot. Damage to a small nerve that runs under the foot where the main fascia attaches to the heel bone. Stress placed on the plantar fascia due to bone spurs. RISK INCREASES WITH:  Activities that place stress on the plantar fascia (running, jumping, pivoting, or cutting). Poor strength and flexibility. Improperly fitted shoes. Tight calf muscles. Flat feet. Failure to warm-up properly before activity. Obesity. PREVENTION Warm up and stretch properly before activity. Allow for adequate recovery between workouts. Maintain physical fitness: Strength, flexibility, and endurance. Cardiovascular fitness. Maintain a health body weight. Avoid stress on the plantar fascia. Wear properly fitted shoes, including  arch supports for individuals who have flat feet.  PROGNOSIS  If treated properly, then the symptoms of plantar fasciitis usually resolve without surgery. However, occasionally surgery is necessary.  RELATED COMPLICATIONS  Recurrent symptoms that may result in a chronic condition. Problems of the lower back that are caused by compensating for the injury, such as limping. Pain or weakness of the foot during push-off following surgery. Chronic inflammation, scarring, and partial or complete fascia tear, occurring more often from repeated injections.  TREATMENT  Treatment initially involves the use of ice and medication to help reduce pain and inflammation. The use of strengthening and stretching exercises may help reduce pain with activity, especially stretches of the Achilles tendon. These exercises may be performed at home or with a therapist. Your caregiver may recommend that you use heel cups of arch supports to help reduce stress on the plantar fascia. Occasionally, corticosteroid injections are given to reduce inflammation. If symptoms persist for greater than 6 months despite non-surgical (conservative), then surgery may be recommended.   MEDICATION  If pain medication is necessary, then nonsteroidal anti-inflammatory medications, such as aspirin and ibuprofen , or other minor pain relievers, such as acetaminophen , are often recommended. Do not take pain medication within 7 days before surgery. Prescription pain relievers may be given if deemed necessary by your caregiver. Use only as directed and only as much as you need. Corticosteroid injections may be given by your caregiver. These injections should be reserved for the most serious cases, because they  may only be given a certain number of times.  HEAT AND COLD Cold treatment (icing) relieves pain and reduces inflammation. Cold treatment should be applied for 10 to 15 minutes every 2 to 3 hours for inflammation and pain and immediately  after any activity that aggravates your symptoms. Use ice packs or massage the area with a piece of ice (ice massage). Heat treatment may be used prior to performing the stretching and strengthening activities prescribed by your caregiver, physical therapist, or athletic trainer. Use a heat pack or soak the injury in warm water .  SEEK IMMEDIATE MEDICAL CARE IF: Treatment seems to offer no benefit, or the condition worsens. Any medications produce adverse side effects.  EXERCISES- RANGE OF MOTION (ROM) AND STRETCHING EXERCISES - Plantar Fasciitis (Heel Spur Syndrome) These exercises may help you when beginning to rehabilitate your injury. Your symptoms may resolve with or without further involvement from your physician, physical therapist or athletic trainer. While completing these exercises, remember:  Restoring tissue flexibility helps normal motion to return to the joints. This allows healthier, less painful movement and activity. An effective stretch should be held for at least 30 seconds. A stretch should never be painful. You should only feel a gentle lengthening or release in the stretched tissue.  RANGE OF MOTION - Toe Extension, Flexion Sit with your right / left leg crossed over your opposite knee. Grasp your toes and gently pull them back toward the top of your foot. You should feel a stretch on the bottom of your toes and/or foot. Hold this stretch for 10 seconds. Now, gently pull your toes toward the bottom of your foot. You should feel a stretch on the top of your toes and or foot. Hold this stretch for 10 seconds. Repeat  times. Complete this stretch 3 times per day.   RANGE OF MOTION - Ankle Dorsiflexion, Active Assisted Remove shoes and sit on a chair that is preferably not on a carpeted surface. Place right / left foot under knee. Extend your opposite leg for support. Keeping your heel down, slide your right / left foot back toward the chair until you feel a stretch at your  ankle or calf. If you do not feel a stretch, slide your bottom forward to the edge of the chair, while still keeping your heel down. Hold this stretch for 10 seconds. Repeat 3 times. Complete this stretch 2 times per day.   STRETCH  Gastroc, Standing Place hands on wall. Extend right / left leg, keeping the front knee somewhat bent. Slightly point your toes inward on your back foot. Keeping your right / left heel on the floor and your knee straight, shift your weight toward the wall, not allowing your back to arch. You should feel a gentle stretch in the right / left calf. Hold this position for 10 seconds. Repeat 3 times. Complete this stretch 2 times per day.  STRETCH  Soleus, Standing Place hands on wall. Extend right / left leg, keeping the other knee somewhat bent. Slightly point your toes inward on your back foot. Keep your right / left heel on the floor, bend your back knee, and slightly shift your weight over the back leg so that you feel a gentle stretch deep in your back calf. Hold this position for 10 seconds. Repeat 3 times. Complete this stretch 2 times per day.  STRETCH  Gastrocsoleus, Standing  Note: This exercise can place a lot of stress on your foot and ankle. Please complete this exercise only  if specifically instructed by your caregiver.  Place the ball of your right / left foot on a step, keeping your other foot firmly on the same step. Hold on to the wall or a rail for balance. Slowly lift your other foot, allowing your body weight to press your heel down over the edge of the step. You should feel a stretch in your right / left calf. Hold this position for 10 seconds. Repeat this exercise with a slight bend in your right / left knee. Repeat 3 times. Complete this stretch 2 times per day.   STRENGTHENING EXERCISES - Plantar Fasciitis (Heel Spur Syndrome)  These exercises may help you when beginning to rehabilitate your injury. They may resolve your symptoms with or  without further involvement from your physician, physical therapist or athletic trainer. While completing these exercises, remember:  Muscles can gain both the endurance and the strength needed for everyday activities through controlled exercises. Complete these exercises as instructed by your physician, physical therapist or athletic trainer. Progress the resistance and repetitions only as guided.  STRENGTH - Towel Curls Sit in a chair positioned on a non-carpeted surface. Place your foot on a towel, keeping your heel on the floor. Pull the towel toward your heel by only curling your toes. Keep your heel on the floor. Repeat 3 times. Complete this exercise 2 times per day.  STRENGTH - Ankle Inversion Secure one end of a rubber exercise band/tubing to a fixed object (table, pole). Loop the other end around your foot just before your toes. Place your fists between your knees. This will focus your strengthening at your ankle. Slowly, pull your big toe up and in, making sure the band/tubing is positioned to resist the entire motion. Hold this position for 10 seconds. Have your muscles resist the band/tubing as it slowly pulls your foot back to the starting position. Repeat 3 times. Complete this exercises 2 times per day.  Document Released: 03/22/2005 Document Revised: 06/14/2011 Document Reviewed: 07/04/2008 [facility] Patient Information 2014 [place], [place].
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
## 2024-02-14 ENCOUNTER — Ambulatory Visit: Admitting: Podiatry
